# Patient Record
Sex: Female | Born: 1965 | Race: White | Hispanic: No | Marital: Married | Smoking: Former smoker
Health system: Southern US, Community
[De-identification: ages and names within clinical notes are randomized; demographics above are authoritative.]

## PROBLEM LIST (undated history)

## (undated) DIAGNOSIS — F32A Depression, unspecified: Secondary | ICD-10-CM

## (undated) DIAGNOSIS — R87619 Unspecified abnormal cytological findings in specimens from cervix uteri: Secondary | ICD-10-CM

## (undated) DIAGNOSIS — K219 Gastro-esophageal reflux disease without esophagitis: Secondary | ICD-10-CM

## (undated) DIAGNOSIS — K259 Gastric ulcer, unspecified as acute or chronic, without hemorrhage or perforation: Secondary | ICD-10-CM

## (undated) DIAGNOSIS — O039 Complete or unspecified spontaneous abortion without complication: Secondary | ICD-10-CM

## (undated) DIAGNOSIS — F329 Major depressive disorder, single episode, unspecified: Secondary | ICD-10-CM

## (undated) DIAGNOSIS — K635 Polyp of colon: Secondary | ICD-10-CM

## (undated) HISTORY — DX: Gastric ulcer, unspecified as acute or chronic, without hemorrhage or perforation: K25.9

## (undated) HISTORY — DX: Unspecified abnormal cytological findings in specimens from cervix uteri: R87.619

## (undated) HISTORY — DX: Major depressive disorder, single episode, unspecified: F32.9

## (undated) HISTORY — PX: APPENDECTOMY: SHX54

## (undated) HISTORY — DX: Gastro-esophageal reflux disease without esophagitis: K21.9

## (undated) HISTORY — DX: Complete or unspecified spontaneous abortion without complication: O03.9

## (undated) HISTORY — DX: Polyp of colon: K63.5

## (undated) HISTORY — DX: Depression, unspecified: F32.A

---

## 1997-07-06 ENCOUNTER — Other Ambulatory Visit: Admission: RE | Admit: 1997-07-06 | Discharge: 1997-07-06 | Payer: Self-pay | Admitting: *Deleted

## 1998-01-05 ENCOUNTER — Other Ambulatory Visit: Admission: RE | Admit: 1998-01-05 | Discharge: 1998-01-05 | Payer: Self-pay | Admitting: Obstetrics and Gynecology

## 1998-06-30 ENCOUNTER — Other Ambulatory Visit: Admission: RE | Admit: 1998-06-30 | Discharge: 1998-06-30 | Payer: Self-pay | Admitting: Obstetrics and Gynecology

## 1998-12-14 ENCOUNTER — Ambulatory Visit (HOSPITAL_COMMUNITY): Admission: RE | Admit: 1998-12-14 | Discharge: 1998-12-14 | Payer: Self-pay | Admitting: Gastroenterology

## 1998-12-28 ENCOUNTER — Other Ambulatory Visit: Admission: RE | Admit: 1998-12-28 | Discharge: 1998-12-28 | Payer: Self-pay | Admitting: *Deleted

## 1999-01-11 ENCOUNTER — Ambulatory Visit (HOSPITAL_COMMUNITY): Admission: RE | Admit: 1999-01-11 | Discharge: 1999-01-11 | Payer: Self-pay | Admitting: Gastroenterology

## 2000-09-13 ENCOUNTER — Other Ambulatory Visit: Admission: RE | Admit: 2000-09-13 | Discharge: 2000-09-13 | Payer: Self-pay | Admitting: Obstetrics and Gynecology

## 2001-11-28 ENCOUNTER — Other Ambulatory Visit: Admission: RE | Admit: 2001-11-28 | Discharge: 2001-11-28 | Payer: Self-pay | Admitting: Obstetrics and Gynecology

## 2002-11-11 ENCOUNTER — Other Ambulatory Visit: Admission: RE | Admit: 2002-11-11 | Discharge: 2002-11-11 | Payer: Self-pay | Admitting: Obstetrics and Gynecology

## 2003-09-29 ENCOUNTER — Inpatient Hospital Stay (HOSPITAL_COMMUNITY): Admission: AD | Admit: 2003-09-29 | Discharge: 2003-10-01 | Payer: Self-pay | Admitting: Obstetrics and Gynecology

## 2004-10-29 ENCOUNTER — Ambulatory Visit (HOSPITAL_COMMUNITY): Admission: RE | Admit: 2004-10-29 | Discharge: 2004-10-29 | Payer: Self-pay | Admitting: Obstetrics and Gynecology

## 2004-10-29 ENCOUNTER — Encounter (INDEPENDENT_AMBULATORY_CARE_PROVIDER_SITE_OTHER): Payer: Self-pay | Admitting: Specialist

## 2006-01-21 ENCOUNTER — Inpatient Hospital Stay (HOSPITAL_COMMUNITY): Admission: AD | Admit: 2006-01-21 | Discharge: 2006-01-23 | Payer: Self-pay | Admitting: Obstetrics and Gynecology

## 2007-02-14 LAB — CONVERTED CEMR LAB: Pap Smear: NORMAL

## 2007-11-26 ENCOUNTER — Ambulatory Visit: Payer: Self-pay | Admitting: Family Medicine

## 2007-11-26 DIAGNOSIS — F3289 Other specified depressive episodes: Secondary | ICD-10-CM | POA: Insufficient documentation

## 2007-11-26 DIAGNOSIS — L2089 Other atopic dermatitis: Secondary | ICD-10-CM | POA: Insufficient documentation

## 2007-11-26 DIAGNOSIS — J309 Allergic rhinitis, unspecified: Secondary | ICD-10-CM | POA: Insufficient documentation

## 2007-11-26 DIAGNOSIS — F329 Major depressive disorder, single episode, unspecified: Secondary | ICD-10-CM | POA: Insufficient documentation

## 2007-11-26 DIAGNOSIS — K219 Gastro-esophageal reflux disease without esophagitis: Secondary | ICD-10-CM | POA: Insufficient documentation

## 2007-12-02 ENCOUNTER — Ambulatory Visit: Payer: Self-pay | Admitting: Family Medicine

## 2007-12-09 LAB — CONVERTED CEMR LAB
AST: 17 units/L (ref 0–37)
Alkaline Phosphatase: 68 units/L (ref 39–117)
BUN: 12 mg/dL (ref 6–23)
Bilirubin, Direct: 0.2 mg/dL (ref 0.0–0.3)
Chloride: 104 meq/L (ref 96–112)
Cholesterol: 141 mg/dL (ref 0–200)
GFR calc Af Amer: 102 mL/min
GFR calc non Af Amer: 84 mL/min
Glucose, Bld: 92 mg/dL (ref 70–99)
Potassium: 4.2 meq/L (ref 3.5–5.1)
Sodium: 140 meq/L (ref 135–145)
Total Protein: 7 g/dL (ref 6.0–8.3)
VLDL: 16 mg/dL (ref 0–40)

## 2008-08-20 ENCOUNTER — Encounter: Payer: Self-pay | Admitting: Family Medicine

## 2008-08-21 ENCOUNTER — Ambulatory Visit: Payer: Self-pay | Admitting: Family Medicine

## 2008-08-21 DIAGNOSIS — R0789 Other chest pain: Secondary | ICD-10-CM | POA: Insufficient documentation

## 2009-01-12 ENCOUNTER — Ambulatory Visit: Payer: Self-pay | Admitting: Family Medicine

## 2009-12-21 ENCOUNTER — Ambulatory Visit: Payer: Self-pay | Admitting: Family Medicine

## 2010-01-19 DIAGNOSIS — J019 Acute sinusitis, unspecified: Secondary | ICD-10-CM | POA: Insufficient documentation

## 2010-01-20 ENCOUNTER — Ambulatory Visit: Payer: Self-pay | Admitting: Internal Medicine

## 2010-03-15 NOTE — Assessment & Plan Note (Signed)
Summary: ?SINUS INFECTION/CLE   Vital Signs:  Patient profile:   45 year old female Height:      64.5 inches Weight:      185.50 pounds BMI:     31.46 Temp:     99.3 degrees F oral Pulse rate:   64 / minute Pulse rhythm:   regular BP sitting:   122 / 82  (left arm) Cuff size:   large  Vitals Entered By: Selena Batten Dance CMA Duncan Dull) (January 19, 2010 12:33 PM) CC: ? Sinus infection Comments Symptoms x2 weeks. No relief with Allegra and Mucinex two times a day    History of Present Illness: CC: sinus pressure  2 wk h/o sinus pressures.  Started as allergies - nasal congestion, coughing, sniffling.  Has been outside cleaning leaves.  Mild productive cough yellow sputum.  + yellow purulent nasal discharge.  + frontal sinus pressure.  using mucinex and allegra regularly for 2 wks.  Also taking tylenol.  Yesterday achey.  + subjective feverish yesterday.  clammy and hot.  No abd pain, n/v/d, rashes.  No ear pain or tooth pain.  Daughter on and off sick as well.  Better with singulair/zyrtec.  No smokers at home.  No asthma.  Current Medications (verified): 1)  Budeprion Sr 100 Mg Xr12h-Tab (Bupropion Hcl) .... Take 1 Tablet By Mouth Three Times A Day  Allergies: 1)  ! Codeine  Past History:  Past Medical History: Last updated: 11/26/2007 Depression GERD  Social History: Last updated: 11/26/2007 Occupation: [employer], occupational Married 2 children Never Smoked Alcohol use-no Drug use-no Regular exercise-walks a lot Diet: healthy, fruits and veggies  Review of Systems       per HPI  Physical Exam  General:  Well-developed,well-nourished,in no acute distress; alert,appropriate and cooperative throughout examination Head:  Normocephalic and atraumatic without obvious abnormalities. No apparent alopecia or balding.  nontender sinuses Eyes:  PERRLA, EOMI Ears:  TMs clear bilaterally Nose:  nares clear, slight injection Mouth:  MMM, no pharyngeal erythema Neck:  R AC  LAD Lungs:  Normal respiratory effort, chest expands symmetrically. Lungs are clear to auscultation, no crackles or wheezes. Heart:  Normal rate and regular rhythm. S1 and S2 normal without gallop, murmur, click, rub or other extra sounds. Pulses:  2+ rad pulses Extremities:  no pedal edema Skin:  Intact without suspicious lesions or rashes   Impression & Recommendations:  Problem # 1:  SINUSITIS, ACUTE (ICD-461.9) hold allegra for now, rec start nasal saline/neti pot, amoxicillin, continue mucinex.  discussed option of flonase.  red flags to return discussed Her updated medication list for this problem includes:    Amoxicillin 875 Mg Tabs (Amoxicillin) ..... One by mouth two times a day x 1 0days  Complete Medication List: 1)  Budeprion Sr 100 Mg Xr12h-tab (Bupropion hcl) .... Take 1 tablet by mouth three times a day 2)  Amoxicillin 875 Mg Tabs (Amoxicillin) .... One by mouth two times a day x 1 0days  Patient Instructions: 1)  You have a sinus infection. 2)  Take medicines as prescribed: amoxicillin twice daily for 10 days. 3)  Take guaifenesin 400mg  IR 1 1/2 pills in am and at noon with plenty of fluid to help mobilize mucous. 4)  Use nasal saline spray or neti pot to help drainage of sinuses. 5)  If you start having fevers >101.5, trouble swallowing or breathing, or are worsening instead of improving as expected, you may need to be seen again. 6)  Good to see  you today, call clinic with questions.  Prescriptions: AMOXICILLIN 875 MG TABS (AMOXICILLIN) one by mouth two times a day x 1 0days  #20 x 0   Entered and Authorized by:   Eustaquio Boyden  MD   Signed by:   Eustaquio Boyden  MD on 01/19/2010   Method used:   Electronically to        [facility]* (retail)       [address]       [place], [place]  [address]       Ph: [phone]       Fax: [phone]   RxID:   [id number]    Orders Added: 1)  Est. Patient Level III [81017]    Current Allergies (reviewed  today): ! CODEINE

## 2010-03-15 NOTE — Assessment & Plan Note (Signed)
Summary: FLU SHOT/CLE  Nurse Visit   Allergies: 1)  ! Codeine  Orders Added: 1)  Admin 1st Vaccine [90471] 2)  Flu Vaccine 45yrs + [16109]    Flu Vaccine Consent Questions     Do you have a history of severe allergic reactions to this vaccine? no    Any prior history of allergic reactions to egg and/or gelatin? no    Do you have a sensitivity to the preservative Thimersol? no    Do you have a past history of Guillan-Barre Syndrome? no    Do you currently have an acute febrile illness? no    Have you ever had a severe reaction to latex? no    Vaccine information given and explained to patient? yes    Are you currently pregnant? no    Lot Number:[id number]   Exp Date:08/13/2010   Site Given  Left Deltoid IM

## 2010-07-01 NOTE — Op Note (Signed)
NAME:  Kendra Jimenez, Kendra Jimenez NO.:  [id number]   MEDICAL RECORD NO.:  [id number]          PATIENT TYPE:  AMB   LOCATION:  [facility]C                           FACILITY:  [facility]   PHYSICIAN:  Richardean Sale, M.D.   DATE OF BIRTH:  1965/09/19   DATE OF PROCEDURE:  10/29/2004  DATE OF DISCHARGE:  10/29/2004                                 OPERATIVE REPORT   PREOPERATIVE DIAGNOSIS:  First trimester missed AB.   POSTOPERATIVE DIAGNOSES:  First trimester missed AB.   PROCEDURE:  Suction D&E.   SURGEON:  Richardean Sale, M.D.   ANESTHESIA:  General with paracervical block.   COMPLICATIONS:  None.   ESTIMATED BLOOD LOSS:  Minimal.   FINDINGS:  Moderate amount of products of conception.   SPECIMENS  products of conception to pathology   INDICATIONS:  This is a 45 year old gravida 3, para 1-0-1-1 white female who  presented at approximately [redacted] weeks gestation complaining of vaginal  spotting. The patient would evaluation with ultrasound which showed a  nonviable intrauterine pregnancy. The fetal pole was noted but no cardiac  activity was seen and the yolk sac appeared enlarged. On the day prior to  the procedure, the patient began to complain of some increase in her vaginal  bleeding and cramping but denies any passage of tissue. Prior to the  procedure, risks, benefits and alternatives of procedure reviewed with the  patient in detail. We discussed the risk of hemorrhage, infection, uterine  injury which could require hysterectomy or surgery on the bowel or bladder.  Also reviewed general anesthetic related risks including DVT, pulmonary  embolus. The patient voiced understanding of these risks and agreed to  proceed before proceeding to the OR.   PROCEDURE:  The patient was taken to the operating room where she was given  general anesthetic. She was then prepped and draped in usual sterile fashion  with Betadine and red rubber catheter was used to drain the bladder. The  bimanual exam was performed which revealed the presence of an 8 weeks size  uterus that was slightly boggy. No obvious masses. It was mobile. Adnexa  were normal. A speculum was then placed in the vagina and 2 mL of 1%  Nesacaine was injected at the 12 o'clock position on the cervix. The single-  tooth tenaculum was then used to grasp the anterior lip of cervix and  paracervical block was then administered using a total of 20 mL of 1%  Nesacaine. The cervix was already slightly dilated with blood clot noted in  the os. The cervix was then gently dilated with Hegar dilators and #8  suction curette was introduced. Suction was performed with a moderate amount  of products of conception removed. This was followed by sharp curettage  until a gritty texture was noted in all four quadrants. The suction was  reintroduced to remove any additional pieces of tissue and the procedure was  then terminated. There was no bleeding coming from the cervical os. The  tenaculum was removed. The tenaculum site was cauterized with silver  nitrate. Speculum was then removed. Bimanual examination was performed.  Uterus was now small, midline, mobile, no masses noted. The patient was then  taken out of the dorsal lithotomy position and was transferred to the  recovery room awake and stable condition. The patient tolerated the  procedure very well with no complication. All sponge, lap, needle and  instrument counts were correct x2.      Richardean Sale, M.D.  Electronically Signed     JW/MEDQ  D:  11/02/2004  T:  11/02/2004  Job:  [id number]

## 2010-07-01 NOTE — H&P (Signed)
NAME:  Kendra Jimenez, Kendra Jimenez NO.:  [id number]   MEDICAL RECORD NO.:  [id number]           PATIENT TYPE:   LOCATION:                                 FACILITY:   PHYSICIAN:  Richardean Sale, M.D.   DATE OF BIRTH:  31-Jan-1966   DATE OF ADMISSION:  10/29/2004  DATE OF DISCHARGE:                                HISTORY & PHYSICAL   PREOPERATIVE DIAGNOSIS:  First trimester missed abortion.   HISTORY OF PRESENT ILLNESS:  This is a 45 year old gravida 3, para 1-0-1-1  white female with a last menstrual period August 23, 2004 who presented on  October 27, 2004 at nine weeks by last menstrual period with complaints of  vaginal bleeding.  The patient underwent ultrasound evaluation and was found  to have a fetal pole measuring seven weeks' gestation without any cardiac  activity noted.  Yolk sac was enlarged and abnormality shaped.  The patient  presents today for surgical management with D&E.  She complains of vaginal  spotting but denies any significant pain or passage of tissue.   PAST MEDICAL HISTORY:  1.  Positive for depression, not currently on medication.  2.  Irritable bowel syndrome.   OB HISTORY:  1.  TAB x1.  2.  Spontaneous vaginal delivery at term x1.  No complications.   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   PAST SURGICAL HISTORY:  1.  Appendectomy.  2.  Wisdom teeth.   FAMILY HISTORY:  No known Down syndrome, spina bifida, or general anomalies,  chromosomal abnormalities, or cystic fibrosis.  No bleeding or blood  clotting disorders.  Positive for hypertension, heart disease, diabetes,  renal cancer, and Crohn's disease.   SOCIAL HISTORY:  She is married.  Denies alcohol, tobacco or drugs.   MEDICATIONS:  Prenatal vitamins.   ALLERGIES:  CODEINE and IODINE.   PHYSICAL EXAMINATION:  VITAL SIGNS:  Afebrile. Vital signs stable.  GENERAL:  She is well-developed, well-nourished white female in no acute  distress.  NECK AND THYROID:   Within normal limits.  HEART:  Regular rate and rhythm.  LUNGS:  Clear to auscultation bilaterally.  ABDOMEN:  Soft, nontender, nondistended.  No palpable masses.  Liver and  spleen are normal.  PELVIC:  Deferred to the OR.  EXTREMITIES:  No clubbing, cyanosis or edema.   LABORATORY DATA:  Blood type is AB positive, antibody screen negative. RPR  nonreactive.  Ultrasound shows intrauterine gestational sac with fetal pole  with no current activity and large yolk sac.   ASSESSMENT:  A 45 year old, gravida 3, para 1-0-1-1 white female with first  trimester missed abortion, presents for surgical management.   PLAN:  Risks, benefits and alternatives of dilation and evacuation have been  reviewed by the patient in detail.  We discussed risks which include but are  not limited to hemorrhage requiring transfusion, infection, scarring of the  uterus or Asherman's syndrome which could cause problems getting pregnant in  the future, injury to the uterus such as uterine perforation which would  require additional surgery through the abdomen and possible injury to the  bowel, the bladder or other intra-abdominal organs.  Also reviewed  anesthetic-related risks and general operative risks.  The patient voiced  understanding of all these other risks and desires to proceed.  Informed  consent is then obtained and all questions answered.      Richardean Sale, M.D.  Electronically Signed     JW/MEDQ  D:  10/28/2004  T:  10/29/2004  Job:  [id number]

## 2010-07-01 NOTE — H&P (Signed)
NAME:  Kendra Jimenez, Kendra Jimenez NO.:  [id number]   MEDICAL RECORD NO.:  [id number]                   PATIENT TYPE:   LOCATION:                                       FACILITY:   PHYSICIAN:  Richardean Sale, M.D.                DATE OF BIRTH:  10-03-1965   DATE OF ADMISSION:  09/29/2003  DATE OF DISCHARGE:                                HISTORY & PHYSICAL   ADMITTING DIAGNOSIS:  Term intrauterine pregnancy for induction of labor.   HISTORY OF PRESENT ILLNESS:  This is a 45 year old, gravida 2, para 0-0-1-0,  white female at 40-1/2 weeks' gestation with a due date of September 26, 2003,  who presents for induction of labor.  The patient's pregnancy has been  complicated by advanced maternal age.  She underwent attempt at first  trimester screen but presented at 14 weeks and given crown-rump length, a  first trimester screen was unable to be performed.  The patient subsequently  declined amniocentesis.  Triple test was performed and within normal limits.  Anatomy ultrasound was within normal limits as well.   SOCIAL HISTORY:  Significant for husband with history of HSV.  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  She has been on  Valtrex for suppressive therapy since 35 weeks and has had no symptoms.  Due  date set by ultrasound performed in the first trimester consistent with last  menstrual period.   Prenatal care has been at [facility] with Dr. Richardean Sale as the  primary physician.  Today, patient reports excellent fetal movement, denies  any loss of fluid, vaginal bleeding, or regular contractions.  Complains of  increasing pelvic pressure and discomfort.  Denies any headache, visual  changes or epigastric pain.   PAST MEDICAL HISTORY:  Positive history of irritable bowel syndrome and  depression.   PAST SURGICAL HISTORY:  1. Wisdom teeth.  2. Appendectomy.   OBSTETRIC HISTORY:  TAB x 1.   GYNECOLOGIC HISTORY:  1.  Remote history of Chlamydia.  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  No treatment.  Last Pap within normal limits.  2. Menses regular q.28d., lasting 3-4 days in length.  3. Menarche at age 34.   FAMILY HISTORY:  Significant for diabetes.  No congenital anomalies, infants  born with birth defects, trisomus, spina bifida, cystic fibrosis, or other  inherited illnesses.   SOCIAL HISTORY:  She is married.  No tobacco, alcohol, or drugs.   MEDICATIONS:  Prenatal vitamins, Valtrex.   ALLERGIES:  1. CODEINE causes itching.  2. IODINE causing redness and edema.  3. BETADINE causes redness of affected areas.   PHYSICAL EXAMINATION:  VITAL SIGNS:  Afebrile.  Vital signs are stable.  Blood pressure is 112/74, weight 218.  Fetal heart tones 131 by Doppler.  GENERAL:  She is a well-developed, well-nourished white female in  no  apparent distress.  HEART:  Regular rate and rhythm.  LUNGS:  Clear to auscultation bilaterally.  ABDOMEN:  Gravid, soft, nontender.  Fundal height is 39.  EXTREMITIES:  Trace edema bilaterally.  No cyanosis or clubbing.  Extremities nontender.  Deep tendon reflexes 2+ bilaterally with no clonus.  CERVIX:  2 cm dilated, 70% effaced, -1 station, vertex, soft, mid position.   PRENATAL LABORATORY DATA:  Pap within normal limits.  Gonorrhea and  Chlamydia screen negative.  Blood type AB positive, antibody screen  negative, toxoplasmosis IgG positive, IgM negative, RPR nonreactive, rubella  immune, hepatitis B surface antigen nonreactive, HIV nonreactive, triple  test within normal limits, 1 hour Glucola 87, group B beta strep negative,  28 week hemoglobin 10.7.  Last ultrasound estimated fetal weight of 3287 g  at 38 weeks.   ASSESSMENT:  A 45 year old, gravida 2, para 0-0-1-0 white female at 40+  weeks gestation with favorable cervix at term.  Possible large for  gestational age infant.   PLAN:  1. The patient would like to proceed with  induction of labor.  Explained to     her that given estimated fetal weight at 38 weeks, the baby could weigh     upwards of 9 pounds, although her fundal height has been within normal     limits.  Reviewed with her possibility of shoulder dystocia should     macrosomia be encountered which could result in permanent neurologic     damage to the fetus, brain damage, injury to the brachial plexus with     paralysis of the upper extremity, possible third or fourth-degree     perineal laceration which could cause complications in the future such as     chronic pain or difficulty with defecation.  The patient voices     understanding of all these risks and desires to proceed with induction.  2. We will admit to labor and delivery.  Plan Pitocin per protocol.     Amniotomy.  We will place internal monitors with IEPC to monitor for     contractions and monitor labor curve closely, continuous fetal     monitoring, epidural when in active labor.                                               Richardean Sale, M.D.    JW/MEDQ  D:  09/28/2003  T:  09/28/2003  Job:  [id number]

## 2010-07-01 NOTE — Op Note (Signed)
NAME:  Kendra Jimenez, SIVAK NO.:  [id number]   MEDICAL RECORD NO.:  [id number]          PATIENT TYPE:  AMB   LOCATION:  SDC                           FACILITY:  [facility]   PHYSICIAN:  Richardean Sale, M.D.   DATE OF BIRTH:  1965/06/26   DATE OF PROCEDURE:  10/29/2004  DATE OF DISCHARGE:                                 OPERATIVE REPORT   PREOPERATIVE DIAGNOSIS:  First trimester missed AB.   POSTOPERATIVE DIAGNOSIS:  First trimester missed AB.   PROCEDURE:  Suction D&E.   SURGEON:  Dr. Richardean Sale.   ASSISTANT:  None.   ANESTHESIA:  Conscious sedation with paracervical block.   COMPLICATIONS:  None.   ESTIMATED BLOOD LOSS:  Minimal.   FINDINGS:  A moderate amount of products of conception sent to pathology.   INDICATIONS:  This is a 39-year gravida 3, para 1-0-1-1 white female, who  presented at 85 weeks' gestation by last menstrual period with vaginal  bleeding.  Ultrasound was performed which revealed a 6-7 weeks' size fetal  pole with no cardiac activity and an enlarged, irregularly shaped yolk sac  consistent with a missed AB.  The patient presents today for surgical  management. Prior to procedure, the risks, benefits, and alternatives of  surgical management with D&E were reviewed with the patient in detail.  We  discussed risks of hemorrhage, infection, injury to the uterus which could  require additional surgery through the abdomen to evaluate for injury to the  bowel or bladder.  I also reviewed general anesthetic-related risks and  general surgical risks.  The patient voiced understanding of all these risks  before proceeding, and informed consent was obtained.  All questions were  answered.   PROCEDURE:  The patient was taken to the operating room where she was given  conscious sedation.  She was then prepped and draped in the usual sterile  fashion with Hibiclens.  She has an allergy to BETADINE.  A red rubber  catheter was then used to drain the  bladder.  Bimanual exam was performed  which revealed the presence of an 8-week size uterus that was boggy, mobile,  anteverted.  No adnexal masses palpated.  The speculum was placed in the  vagina, and 20 mL of 1% Nesacaine were then injected to administer a  paracervical block.  A single-tooth tenaculum was used to grasp the anterior  lip of the cervix.  The uterus then sounded to 8.5 2 cm.  The cervix was  dilated to #25, and the #8 suction curette was then introduced.  Suction was  performed, yielding a moderate amounts of products of conception.  This was  followed by curettage.  The suction was reintroduced to remove any  additional products, and curettage was performed until a gritty texture was  noted in all four quadrants.  At this point, the procedure was terminated.  There was no bleeding coming from the cervical os.  The single-tooth  tenaculum was removed.  The tenaculum site was made hemostatic with  application of silver nitrate.  Speculum was then removed.  The uterus was then examined.  It was now small, midline, firm, no masses,  and mobile.  The patient tolerated the procedure very well.  All sponge,  lap, needle, and instrument  counts were correct x2.  The patient was taken  to the recovery room awake and in stable condition.  There were no  complications.      Richardean Sale, M.D.     JW/MEDQ  D:  10/29/2004  T:  10/29/2004  Job:  [id number]

## 2010-07-04 ENCOUNTER — Other Ambulatory Visit: Payer: Self-pay | Admitting: Obstetrics

## 2011-01-11 ENCOUNTER — Ambulatory Visit (INDEPENDENT_AMBULATORY_CARE_PROVIDER_SITE_OTHER): Payer: BC Managed Care – PPO

## 2011-01-11 DIAGNOSIS — Z23 Encounter for immunization: Secondary | ICD-10-CM

## 2011-11-23 ENCOUNTER — Ambulatory Visit (INDEPENDENT_AMBULATORY_CARE_PROVIDER_SITE_OTHER): Payer: BC Managed Care – PPO

## 2011-11-23 DIAGNOSIS — Z23 Encounter for immunization: Secondary | ICD-10-CM

## 2012-12-25 ENCOUNTER — Ambulatory Visit (INDEPENDENT_AMBULATORY_CARE_PROVIDER_SITE_OTHER): Payer: BC Managed Care – PPO

## 2012-12-25 ENCOUNTER — Ambulatory Visit: Payer: BC Managed Care – PPO

## 2012-12-25 DIAGNOSIS — Z23 Encounter for immunization: Secondary | ICD-10-CM

## 2014-06-04 ENCOUNTER — Telehealth: Payer: Self-pay | Admitting: *Deleted

## 2014-06-04 MED ORDER — AMOXICILLIN 500 MG PO CAPS: 1000.0000 mg | ORAL_CAPSULE | Freq: Two times a day (BID) | ORAL | Status: DC

## 2014-06-04 NOTE — Telephone Encounter (Signed)
I do not have a pharmacy... Send in amox as written.

## 2014-06-04 NOTE — Telephone Encounter (Signed)
Mom calling stating that both her daughters where seen here and diag with strep, now mom is having symptoms and is asking for abx. Per Dr. Ermalene SearingBedsole she would call something in.

## 2014-06-04 NOTE — Telephone Encounter (Signed)
Spoke with patient, [facility].  Medication phoned to pharmacy.

## 2015-06-04 DIAGNOSIS — M9901 Segmental and somatic dysfunction of cervical region: Secondary | ICD-10-CM | POA: Diagnosis not present

## 2015-06-04 DIAGNOSIS — R51 Headache: Secondary | ICD-10-CM | POA: Diagnosis not present

## 2015-06-04 DIAGNOSIS — M5412 Radiculopathy, cervical region: Secondary | ICD-10-CM | POA: Diagnosis not present

## 2015-06-04 DIAGNOSIS — M9902 Segmental and somatic dysfunction of thoracic region: Secondary | ICD-10-CM | POA: Diagnosis not present

## 2015-06-04 DIAGNOSIS — M791 Myalgia: Secondary | ICD-10-CM | POA: Diagnosis not present

## 2015-06-08 DIAGNOSIS — M9902 Segmental and somatic dysfunction of thoracic region: Secondary | ICD-10-CM | POA: Diagnosis not present

## 2015-06-08 DIAGNOSIS — M9901 Segmental and somatic dysfunction of cervical region: Secondary | ICD-10-CM | POA: Diagnosis not present

## 2015-06-08 DIAGNOSIS — M5412 Radiculopathy, cervical region: Secondary | ICD-10-CM | POA: Diagnosis not present

## 2015-06-14 DIAGNOSIS — N83202 Unspecified ovarian cyst, left side: Secondary | ICD-10-CM | POA: Diagnosis not present

## 2015-06-23 DIAGNOSIS — M4012 Other secondary kyphosis, cervical region: Secondary | ICD-10-CM | POA: Diagnosis not present

## 2015-06-23 DIAGNOSIS — M50323 Other cervical disc degeneration at C6-C7 level: Secondary | ICD-10-CM | POA: Diagnosis not present

## 2015-06-23 DIAGNOSIS — M9901 Segmental and somatic dysfunction of cervical region: Secondary | ICD-10-CM | POA: Diagnosis not present

## 2015-06-23 DIAGNOSIS — M62830 Muscle spasm of back: Secondary | ICD-10-CM | POA: Diagnosis not present

## 2015-06-24 DIAGNOSIS — M9901 Segmental and somatic dysfunction of cervical region: Secondary | ICD-10-CM | POA: Diagnosis not present

## 2015-06-24 DIAGNOSIS — M62830 Muscle spasm of back: Secondary | ICD-10-CM | POA: Diagnosis not present

## 2015-06-24 DIAGNOSIS — M4012 Other secondary kyphosis, cervical region: Secondary | ICD-10-CM | POA: Diagnosis not present

## 2015-06-24 DIAGNOSIS — M50323 Other cervical disc degeneration at C6-C7 level: Secondary | ICD-10-CM | POA: Diagnosis not present

## 2015-06-25 DIAGNOSIS — M9901 Segmental and somatic dysfunction of cervical region: Secondary | ICD-10-CM | POA: Diagnosis not present

## 2015-06-25 DIAGNOSIS — M50323 Other cervical disc degeneration at C6-C7 level: Secondary | ICD-10-CM | POA: Diagnosis not present

## 2015-06-25 DIAGNOSIS — M62830 Muscle spasm of back: Secondary | ICD-10-CM | POA: Diagnosis not present

## 2015-06-25 DIAGNOSIS — M4012 Other secondary kyphosis, cervical region: Secondary | ICD-10-CM | POA: Diagnosis not present

## 2015-06-28 DIAGNOSIS — M62830 Muscle spasm of back: Secondary | ICD-10-CM | POA: Diagnosis not present

## 2015-06-28 DIAGNOSIS — M50323 Other cervical disc degeneration at C6-C7 level: Secondary | ICD-10-CM | POA: Diagnosis not present

## 2015-06-28 DIAGNOSIS — M4012 Other secondary kyphosis, cervical region: Secondary | ICD-10-CM | POA: Diagnosis not present

## 2015-06-28 DIAGNOSIS — M9901 Segmental and somatic dysfunction of cervical region: Secondary | ICD-10-CM | POA: Diagnosis not present

## 2015-06-30 DIAGNOSIS — M62830 Muscle spasm of back: Secondary | ICD-10-CM | POA: Diagnosis not present

## 2015-06-30 DIAGNOSIS — M4012 Other secondary kyphosis, cervical region: Secondary | ICD-10-CM | POA: Diagnosis not present

## 2015-06-30 DIAGNOSIS — M9901 Segmental and somatic dysfunction of cervical region: Secondary | ICD-10-CM | POA: Diagnosis not present

## 2015-06-30 DIAGNOSIS — M50323 Other cervical disc degeneration at C6-C7 level: Secondary | ICD-10-CM | POA: Diagnosis not present

## 2015-07-01 DIAGNOSIS — M50323 Other cervical disc degeneration at C6-C7 level: Secondary | ICD-10-CM | POA: Diagnosis not present

## 2015-07-01 DIAGNOSIS — M62830 Muscle spasm of back: Secondary | ICD-10-CM | POA: Diagnosis not present

## 2015-07-01 DIAGNOSIS — M9901 Segmental and somatic dysfunction of cervical region: Secondary | ICD-10-CM | POA: Diagnosis not present

## 2015-07-01 DIAGNOSIS — M4012 Other secondary kyphosis, cervical region: Secondary | ICD-10-CM | POA: Diagnosis not present

## 2015-07-09 DIAGNOSIS — R109 Unspecified abdominal pain: Secondary | ICD-10-CM | POA: Diagnosis not present

## 2015-07-09 DIAGNOSIS — Z113 Encounter for screening for infections with a predominantly sexual mode of transmission: Secondary | ICD-10-CM | POA: Diagnosis not present

## 2015-07-09 DIAGNOSIS — R1032 Left lower quadrant pain: Secondary | ICD-10-CM | POA: Diagnosis not present

## 2015-07-13 DIAGNOSIS — M9901 Segmental and somatic dysfunction of cervical region: Secondary | ICD-10-CM | POA: Diagnosis not present

## 2015-07-13 DIAGNOSIS — M62830 Muscle spasm of back: Secondary | ICD-10-CM | POA: Diagnosis not present

## 2015-07-13 DIAGNOSIS — M50323 Other cervical disc degeneration at C6-C7 level: Secondary | ICD-10-CM | POA: Diagnosis not present

## 2015-07-13 DIAGNOSIS — M4012 Other secondary kyphosis, cervical region: Secondary | ICD-10-CM | POA: Diagnosis not present

## 2015-07-14 DIAGNOSIS — M50323 Other cervical disc degeneration at C6-C7 level: Secondary | ICD-10-CM | POA: Diagnosis not present

## 2015-07-14 DIAGNOSIS — M62830 Muscle spasm of back: Secondary | ICD-10-CM | POA: Diagnosis not present

## 2015-07-14 DIAGNOSIS — M4012 Other secondary kyphosis, cervical region: Secondary | ICD-10-CM | POA: Diagnosis not present

## 2015-07-14 DIAGNOSIS — M9901 Segmental and somatic dysfunction of cervical region: Secondary | ICD-10-CM | POA: Diagnosis not present

## 2015-07-15 DIAGNOSIS — M62830 Muscle spasm of back: Secondary | ICD-10-CM | POA: Diagnosis not present

## 2015-07-15 DIAGNOSIS — M4012 Other secondary kyphosis, cervical region: Secondary | ICD-10-CM | POA: Diagnosis not present

## 2015-07-15 DIAGNOSIS — M9901 Segmental and somatic dysfunction of cervical region: Secondary | ICD-10-CM | POA: Diagnosis not present

## 2015-07-15 DIAGNOSIS — M50323 Other cervical disc degeneration at C6-C7 level: Secondary | ICD-10-CM | POA: Diagnosis not present

## 2015-07-21 DIAGNOSIS — R109 Unspecified abdominal pain: Secondary | ICD-10-CM | POA: Diagnosis not present

## 2015-07-23 DIAGNOSIS — M62830 Muscle spasm of back: Secondary | ICD-10-CM | POA: Diagnosis not present

## 2015-07-23 DIAGNOSIS — M9901 Segmental and somatic dysfunction of cervical region: Secondary | ICD-10-CM | POA: Diagnosis not present

## 2015-07-23 DIAGNOSIS — M50323 Other cervical disc degeneration at C6-C7 level: Secondary | ICD-10-CM | POA: Diagnosis not present

## 2015-07-23 DIAGNOSIS — M4012 Other secondary kyphosis, cervical region: Secondary | ICD-10-CM | POA: Diagnosis not present

## 2015-07-29 DIAGNOSIS — Z1211 Encounter for screening for malignant neoplasm of colon: Secondary | ICD-10-CM | POA: Diagnosis not present

## 2015-07-30 DIAGNOSIS — M9901 Segmental and somatic dysfunction of cervical region: Secondary | ICD-10-CM | POA: Diagnosis not present

## 2015-07-30 DIAGNOSIS — M50323 Other cervical disc degeneration at C6-C7 level: Secondary | ICD-10-CM | POA: Diagnosis not present

## 2015-07-30 DIAGNOSIS — M62830 Muscle spasm of back: Secondary | ICD-10-CM | POA: Diagnosis not present

## 2015-07-30 DIAGNOSIS — M4012 Other secondary kyphosis, cervical region: Secondary | ICD-10-CM | POA: Diagnosis not present

## 2015-08-04 DIAGNOSIS — D122 Benign neoplasm of ascending colon: Secondary | ICD-10-CM | POA: Diagnosis not present

## 2015-08-04 DIAGNOSIS — K6389 Other specified diseases of intestine: Secondary | ICD-10-CM | POA: Diagnosis not present

## 2015-08-04 DIAGNOSIS — K635 Polyp of colon: Secondary | ICD-10-CM | POA: Diagnosis not present

## 2015-08-04 DIAGNOSIS — Z1211 Encounter for screening for malignant neoplasm of colon: Secondary | ICD-10-CM | POA: Diagnosis not present

## 2015-08-04 DIAGNOSIS — D125 Benign neoplasm of sigmoid colon: Secondary | ICD-10-CM | POA: Diagnosis not present

## 2015-08-04 DIAGNOSIS — D123 Benign neoplasm of transverse colon: Secondary | ICD-10-CM | POA: Diagnosis not present

## 2015-08-05 ENCOUNTER — Other Ambulatory Visit: Payer: Self-pay | Admitting: Gastroenterology

## 2015-08-05 DIAGNOSIS — M9901 Segmental and somatic dysfunction of cervical region: Secondary | ICD-10-CM | POA: Diagnosis not present

## 2015-08-05 DIAGNOSIS — R933 Abnormal findings on diagnostic imaging of other parts of digestive tract: Secondary | ICD-10-CM

## 2015-08-05 DIAGNOSIS — M50323 Other cervical disc degeneration at C6-C7 level: Secondary | ICD-10-CM | POA: Diagnosis not present

## 2015-08-05 DIAGNOSIS — M62830 Muscle spasm of back: Secondary | ICD-10-CM | POA: Diagnosis not present

## 2015-08-05 DIAGNOSIS — M4012 Other secondary kyphosis, cervical region: Secondary | ICD-10-CM | POA: Diagnosis not present

## 2015-08-05 DIAGNOSIS — R1032 Left lower quadrant pain: Secondary | ICD-10-CM

## 2015-08-19 ENCOUNTER — Ambulatory Visit
Admission: RE | Admit: 2015-08-19 | Discharge: 2015-08-19 | Disposition: A | Discharge status: Home / Self Care | Payer: BLUE CROSS/BLUE SHIELD | Source: Ambulatory Visit | Attending: Gastroenterology | Admitting: Gastroenterology

## 2015-08-19 DIAGNOSIS — R1032 Left lower quadrant pain: Secondary | ICD-10-CM | POA: Diagnosis not present

## 2015-08-19 DIAGNOSIS — R933 Abnormal findings on diagnostic imaging of other parts of digestive tract: Secondary | ICD-10-CM

## 2015-08-19 MED ORDER — IOPAMIDOL (ISOVUE-300) INJECTION 61%
100.0000 mL | Freq: Once | INTRAVENOUS | Status: AC | PRN
Start: 2015-08-19 — End: 2015-08-19
  Administered 2015-08-19: 100 mL via INTRAVENOUS

## 2015-09-08 DIAGNOSIS — R102 Pelvic and perineal pain: Secondary | ICD-10-CM | POA: Diagnosis not present

## 2015-09-08 DIAGNOSIS — N898 Other specified noninflammatory disorders of vagina: Secondary | ICD-10-CM | POA: Diagnosis not present

## 2015-09-09 DIAGNOSIS — Z79899 Other long term (current) drug therapy: Secondary | ICD-10-CM | POA: Diagnosis not present

## 2015-09-09 DIAGNOSIS — M25561 Pain in right knee: Secondary | ICD-10-CM | POA: Diagnosis not present

## 2015-09-09 DIAGNOSIS — M2550 Pain in unspecified joint: Secondary | ICD-10-CM | POA: Diagnosis not present

## 2015-09-09 DIAGNOSIS — M545 Low back pain: Secondary | ICD-10-CM | POA: Diagnosis not present

## 2015-09-10 ENCOUNTER — Ambulatory Visit: Payer: Self-pay | Admitting: Family Medicine

## 2015-09-20 ENCOUNTER — Ambulatory Visit (INDEPENDENT_AMBULATORY_CARE_PROVIDER_SITE_OTHER): Payer: BLUE CROSS/BLUE SHIELD | Admitting: Family Medicine

## 2015-09-20 ENCOUNTER — Encounter: Payer: Self-pay | Admitting: Family Medicine

## 2015-09-20 VITALS — BP 124/80 | HR 64 | Temp 98.6°F | Ht 64.0 in | Wt 176.0 lb

## 2015-09-20 DIAGNOSIS — M542 Cervicalgia: Secondary | ICD-10-CM

## 2015-09-20 DIAGNOSIS — M533 Sacrococcygeal disorders, not elsewhere classified: Secondary | ICD-10-CM

## 2015-09-20 DIAGNOSIS — Z87898 Personal history of other specified conditions: Secondary | ICD-10-CM | POA: Diagnosis not present

## 2015-09-20 DIAGNOSIS — R1030 Lower abdominal pain, unspecified: Secondary | ICD-10-CM

## 2015-09-20 DIAGNOSIS — K5909 Other constipation: Secondary | ICD-10-CM

## 2015-09-20 MED ORDER — OMEPRAZOLE 20 MG PO CPDR: 20.0000 mg | DELAYED_RELEASE_CAPSULE | Freq: Every day | ORAL | 5 refills | Status: DC

## 2015-09-20 MED ORDER — NAPROXEN 500 MG PO TABS: 500.0000 mg | ORAL_TABLET | Freq: Two times a day (BID) | ORAL | 5 refills | Status: DC

## 2015-09-20 MED ORDER — LINACLOTIDE 290 MCG PO CAPS: 290.0000 ug | ORAL_CAPSULE | Freq: Every day | ORAL | 5 refills | Status: DC

## 2015-09-20 NOTE — Progress Notes (Addendum)
Kendra OchoaKristin Jimenez  MRN: [id number] DOB: 11/16/1965  Subjective:  HPI   The patient is a 50 year old female that presents as a new patient to establish care with the practice.  She has 3 main complaints and they are; Abdominal pain Neck pain  Tailbone pain.  She was in to see her gyn for IUD placement.  One week later she had pelvic pain, ultrasound was obtained and revealed bilateral ovarian cysts, IUD was removed at the patient's request.  On f/u she continued with pelvic pain, one cyst cleared and patient was told she had elevated amount of white cells on vaginal discharge, no treatment.    Pain continued and patient was seen again, all cysts clear and provider was unable to determine the status of the white cells in the discharge.  Patient at that time was referred to GI.  GI did colonoscopy and 5 pre-cancerous polyps were removed, CT was ordered.  CT neg except for stool, recommended Colace BID,Miralax daily and refer back to gyn.  Patient has had no relief from stool softeners and has been using MOM for the last 4-5 days.  Pain is on the left side more than the right.  Patient had Pap smear 1 week prior to the placement of her IUD.  The pap was abnormal and biopsies were done.  Biopsies were neg  Patient has had neck pain since December.  She has had x-rays and alignment with the chiropractor.  She was having numbness in her arms and fingers and since seeing chiropractor that has improved but the pain remains.   Tailbone pain-patient has had pain in the area of her tailbone since December with no known trauma.  She was referred to a rheumatology who only recommended she see a special type of physical therapist that would internally massage the tailbone for pain relief.  Patient has decided not to do this yet.    Patient Active Problem List   Diagnosis Date Noted  . SINUSITIS, ACUTE 01/19/2010  . CHEST PAIN, ATYPICAL 08/21/2008  . DEPRESSION 11/26/2007  . ALLERGIC RHINITIS 11/26/2007   . GERD 11/26/2007  . ECZEMA, ATOPIC 11/26/2007    Past Medical History:  Diagnosis Date  . Abnormal Papanicolaou smear of cervix   . Colon polyps   . Depression   . Gastric ulcer   . GERD (gastroesophageal reflux disease)   . Spon abortion w/o complication     Social History   Social History  . Marital status: Married    Spouse name: N/A  . Number of children: N/A  . Years of education: N/A   Occupational History  . Not on file.   Social History Main Topics  . Smoking status: Former Games developermoker  . Smokeless tobacco: Never Used  . Alcohol use Yes     Comment: 1 per month  . Drug use:     Types: Marijuana     Comment: college  . Sexual activity: Yes    Birth control/ protection: Condom   Other Topics Concern  . Not on file   Social History Narrative  . No narrative on file    Outpatient Medications Prior to Visit  Medication Sig Dispense Refill  . amoxicillin (AMOXIL) 500 MG capsule Take 2 capsules (1,000 mg total) by mouth 2 (two) times daily. 40 capsule 0   No facility-administered medications prior to visit.    Outpatient Encounter Prescriptions as of 09/20/2015  Medication Sig  . buPROPion (WELLBUTRIN XL) 300 MG 24 hr tablet  Take 300 mg by mouth daily.  . [DISCONTINUED] amoxicillin (AMOXIL) 500 MG capsule Take 2 capsules (1,000 mg total) by mouth 2 (two) times daily.   No facility-administered encounter medications on file as of 09/20/2015.    Allergies  Allergen Reactions  . Codeine     REACTION: Itching  . Povidone Iodine Rash    Pt. States had a reaction to topical iodine when giving blood. 15 years ago//kp    Review of Systems  Constitutional: Negative.   HENT: Negative.   Eyes: Negative.   Respiratory: Negative.   Cardiovascular: Negative.   Gastrointestinal: Positive for abdominal pain and constipation.  Genitourinary: Positive for frequency.  Musculoskeletal: Positive for back pain and neck pain (interferes with sleep).  Skin: Negative.     Neurological: Negative.   Endo/Heme/Allergies: Negative.   Psychiatric/Behavioral: Negative.    Objective:  BP 124/80   Pulse 64   Temp 98.6 F (37 C) (Oral)   Ht 5\' 4"  (1.626 m)   Wt 176 lb (79.8 kg)   BMI 30.21 kg/m   Physical Exam  Constitutional: She is oriented to person, place, and time and well-developed, well-nourished, and in no distress.  HENT:  Head: Normocephalic and atraumatic.  Right Ear: External ear normal.  Left Ear: External ear normal.  Nose: Nose normal.  Mouth/Throat: Oropharynx is clear and moist.  Eyes: Conjunctivae are normal. Pupils are equal, round, and reactive to light.  Neck: Neck supple.  Limited ROM in all directions  Cardiovascular: Normal rate, regular rhythm, normal heart sounds and intact distal pulses.   Pulmonary/Chest: Effort normal and breath sounds normal.  Abdominal: Soft. She exhibits no distension and no mass. There is no tenderness. There is no rebound and no guarding.  Mildly decreased bowel sounds  Musculoskeletal: Normal range of motion. She exhibits no edema, tenderness or deformity.  She appears to have some mild limited range of motion with lateral rotation of her neck  Neurological: She is alert and oriented to person, place, and time. Gait normal.  Skin: Skin is warm and dry.  Psychiatric: Mood, memory, affect and judgment normal.    Assessment and Plan :  1. Other constipation I'm not sure what to make of her constellation of symptoms. She has had GI and GYN workup. - linaclotide (LINZESS) 290 MCG CAPS capsule; Take 1 capsule (290 mcg total) by mouth daily before breakfast.  Dispense: 30 capsule; Refill: 5  2. Neck pain  - naproxen (NAPROSYN) 500 MG tablet; Take 1 tablet (500 mg total) by mouth 2 (two) times daily with a meal.  Dispense: 30 tablet; Refill: 5  3. Lower abdominal pain Does not appear to be a gynecologic issue. Patient feels it may be related to her IUD. - linaclotide (LINZESS) 290 MCG CAPS capsule;  Take 1 capsule (290 mcg total) by mouth daily before breakfast.  Dispense: 30 capsule; Refill: 5  4. Coccyxdynia  - naproxen (NAPROSYN) 500 MG tablet; Take 1 tablet (500 mg total) by mouth 2 (two) times daily with a meal.  Dispense: 30 tablet; Refill: 5  5. History of ulcer disease She actually had esophageal erosions as a young lady. We'll watch carefully with this. Will protect with PPI - naproxen (NAPROSYN) 500 MG tablet; Take 1 tablet (500 mg total) by mouth 2 (two) times daily with a meal.  Dispense: 30 tablet; Refill: 5    Patient was seen and examined by Dr. Gerlene Burdock L. Wendelyn Breslow and the note was scribed by Janey Greaser, RMA.  Julieanne Manson MD [facility] 09/20/2015 3:47 PM

## 2015-09-21 ENCOUNTER — Ambulatory Visit
Admission: RE | Admit: 2015-09-21 | Discharge: 2015-09-21 | Disposition: A | Discharge status: Home / Self Care | Payer: BLUE CROSS/BLUE SHIELD | Source: Ambulatory Visit | Attending: Family Medicine | Admitting: Family Medicine

## 2015-09-21 DIAGNOSIS — M47812 Spondylosis without myelopathy or radiculopathy, cervical region: Secondary | ICD-10-CM | POA: Diagnosis not present

## 2015-09-21 DIAGNOSIS — M542 Cervicalgia: Secondary | ICD-10-CM | POA: Insufficient documentation

## 2015-09-24 ENCOUNTER — Telehealth: Payer: Self-pay

## 2015-09-24 NOTE — Telephone Encounter (Signed)
Patient advised of x-ray. sd

## 2015-09-28 ENCOUNTER — Telehealth: Payer: Self-pay

## 2015-09-28 NOTE — Telephone Encounter (Signed)
LMTCB 09/28/2015  Thanks,   -Charmika Macdonnell  

## 2015-09-28 NOTE — Telephone Encounter (Signed)
Patient returned Laura's call.  Thanks, Fortune Brandsteri

## 2015-09-28 NOTE — Telephone Encounter (Signed)
lmtcb Ranard Harte Drozdowski, CMA  

## 2015-09-28 NOTE — Telephone Encounter (Signed)
Pt is returning call.  UX#[phone]/ZDCB#[phone]/MW

## 2015-09-28 NOTE — Telephone Encounter (Signed)
-----   Message from Maple Hudsonichard L Gilbert Jr., MD sent at 09/21/2015  2:53 PM EDT ----- Arthritis and mild DDD of neck.

## 2015-09-29 NOTE — Telephone Encounter (Signed)
Pt advised-aa 

## 2015-10-04 ENCOUNTER — Ambulatory Visit (INDEPENDENT_AMBULATORY_CARE_PROVIDER_SITE_OTHER): Payer: BLUE CROSS/BLUE SHIELD | Admitting: Family Medicine

## 2015-10-04 ENCOUNTER — Ambulatory Visit: Payer: BLUE CROSS/BLUE SHIELD | Admitting: Family Medicine

## 2015-10-04 VITALS — BP 104/72 | HR 62 | Temp 98.4°F | Resp 14 | Wt 179.0 lb

## 2015-10-04 DIAGNOSIS — R1030 Lower abdominal pain, unspecified: Secondary | ICD-10-CM

## 2015-10-04 DIAGNOSIS — M542 Cervicalgia: Secondary | ICD-10-CM

## 2015-10-04 DIAGNOSIS — K5909 Other constipation: Secondary | ICD-10-CM | POA: Diagnosis not present

## 2015-10-04 MED ORDER — LINACLOTIDE 72 MCG PO CAPS: 72.0000 ug | ORAL_CAPSULE | Freq: Every day | ORAL | 5 refills | Status: DC

## 2015-10-04 NOTE — Progress Notes (Signed)
Subjective:  HPI  Patient is here for 2 week follow up on constipation, abdominal pain and neck pain. She was started on Linzess and states if she takes this medication she has a bowel movement but without it she does not.  Abdominal pain is not as constant, she started her menstrual cycle and wonders if some of this was related. She feels ok today but she has not eaten anything yet and did not take Juice plus vitamin today. Neck pain is better with taking naproxen.  Prior to Admission medications   Medication Sig Start Date End Date Taking? Authorizing Provider  buPROPion (WELLBUTRIN XL) 300 MG 24 hr tablet Take 300 mg by mouth daily.   Yes Historical Provider, MD  linaclotide Karlene Einstein(LINZESS) 290 MCG CAPS capsule Take 1 capsule (290 mcg total) by mouth daily before breakfast. 09/20/15  Yes Maple Hudsonichard L Kendrew Paci Jr., MD  naproxen (NAPROSYN) 500 MG tablet Take 1 tablet (500 mg total) by mouth 2 (two) times daily with a meal. 09/20/15  Yes Maple Hudsonichard L Savyon Loken Jr., MD  omeprazole (PRILOSEC) 20 MG capsule Take 1 capsule (20 mg total) by mouth daily. 09/20/15  Yes Tesla Keeler Hulen ShoutsL Xzavier Swinger Jr., MD  Nutritional Supplements (JUICE PLUS FIBRE PO) Take by mouth daily.    Historical Provider, MD    Patient Active Problem List   Diagnosis Date Noted  . SINUSITIS, ACUTE 01/19/2010  . CHEST PAIN, ATYPICAL 08/21/2008  . DEPRESSION 11/26/2007  . ALLERGIC RHINITIS 11/26/2007  . GERD 11/26/2007  . ECZEMA, ATOPIC 11/26/2007    Past Medical History:  Diagnosis Date  . Abnormal Papanicolaou smear of cervix   . Colon polyps   . Depression   . Gastric ulcer   . GERD (gastroesophageal reflux disease)   . Spon abortion w/o complication     Social History   Social History  . Marital status: Married    Spouse name: N/A  . Number of children: N/A  . Years of education: N/A   Occupational History  . Not on file.   Social History Main Topics  . Smoking status: Former Games developermoker  . Smokeless tobacco: Never Used  .  Alcohol use Yes     Comment: 1 per month  . Drug use:     Types: Marijuana     Comment: college  . Sexual activity: Yes    Birth control/ protection: Condom   Other Topics Concern  . Not on file   Social History Narrative  . No narrative on file    Allergies  Allergen Reactions  . Codeine     REACTION: Itching  . Povidone Iodine Rash    Pt. States had a reaction to topical iodine when giving blood. 15 years ago//kp    Review of Systems  Constitutional: Negative.   Eyes: Negative.   Respiratory: Negative.   Cardiovascular: Negative.   Gastrointestinal: Positive for abdominal pain and constipation.  Musculoskeletal: Negative.   Skin: Negative.   Neurological: Negative.   Endo/Heme/Allergies: Negative.   Psychiatric/Behavioral: Negative.     Immunization History  Administered Date(s) Administered  . Influenza Split 01/11/2011, 11/23/2011  . Influenza Whole 11/26/2007, 01/12/2009, 12/21/2009  . Influenza,inj,Quad PF,36+ Mos 12/25/2012  . Td 02/13/2002   Objective:  BP 104/72   Pulse 62   Temp 98.4 F (36.9 C)   Resp 14   Wt 179 lb (81.2 kg)   LMP 09/07/2015   BMI 30.73 kg/m   Physical Exam  Constitutional: She is oriented to person, place, and  time and well-developed, well-nourished, and in no distress.  HENT:  Head: Normocephalic and atraumatic.  Right Ear: External ear normal.  Left Ear: External ear normal.  Nose: Nose normal.  Eyes: Conjunctivae are normal. Pupils are equal, round, and reactive to light.  Neck: Normal range of motion. Neck supple.  Cardiovascular: Normal rate, regular rhythm, normal heart sounds and intact distal pulses.   No murmur heard. Pulmonary/Chest: Effort normal and breath sounds normal. No respiratory distress. She has no wheezes.  Abdominal: Soft. She exhibits no distension. There is no tenderness. There is no rebound.  Musculoskeletal: She exhibits no edema or tenderness.  Neurological: She is alert and oriented to  person, place, and time. Gait normal.  Skin: Skin is warm and dry.  Psychiatric: Mood, memory, affect and judgment normal.    Lab Results  Component Value Date   GLUCOSE 92 12/02/2007   CHOL 141 12/02/2007   TRIG 78 12/02/2007   HDL 34.7 (L) 12/02/2007   LDLCALC 91 12/02/2007    CMP     Component Value Date/Time   NA 140 12/02/2007 1036   K 4.2 12/02/2007 1036   CL 104 12/02/2007 1036   CO2 29 12/02/2007 1036   GLUCOSE 92 12/02/2007 1036   BUN 12 12/02/2007 1036   CREATININE 0.8 12/02/2007 1036   CALCIUM 9.0 12/02/2007 1036   PROT 7.0 12/02/2007 1036   ALBUMIN 3.8 12/02/2007 1036   AST 17 12/02/2007 1036   ALT 15 12/02/2007 1036   ALKPHOS 68 12/02/2007 1036   BILITOT 0.8 12/02/2007 1036   GFRNONAA 84 12/02/2007 1036   GFRAA 102 12/02/2007 1036    Assessment and Plan :  1. Other constipation Better but bowels are diarrhea like. Will decrease Linzzess dose, advised patient to take daily. Re check on the next visit.  2. Neck pain Better. Advised patient to continue taking Naproxen through labor day and follow.  3. Lower abdominal pain Better, could have been cycle related. Also discussed trying not to take Juice plus vitamin and see if she gets any improvement. Overall she is feeling much better. I think she will continue improved. We'll continue to treat the  Constipation but I think the other issues will probably be self-limited Patient was seen and examined by Dr. Bosie Closichard L Qais Jowers and note was scribed by Samara DeistAnastasiya Aleksandrova, RMA.  I have done the exam and reviewed the above chart and it is accurate to the best of my knowledge.    Julieanne Mansonichard Odessia Asleson MD [facility] 10/04/2015 10:02 AM

## 2015-10-07 ENCOUNTER — Encounter: Payer: Self-pay | Admitting: *Deleted

## 2015-10-12 ENCOUNTER — Encounter: Payer: Self-pay | Admitting: Family Medicine

## 2015-10-12 DIAGNOSIS — R102 Pelvic and perineal pain: Secondary | ICD-10-CM | POA: Diagnosis not present

## 2015-10-21 DIAGNOSIS — N63 Unspecified lump in breast: Secondary | ICD-10-CM | POA: Diagnosis not present

## 2015-10-21 LAB — HM MAMMOGRAPHY: HM MAMMO: NORMAL (ref 0–4)

## 2015-11-02 ENCOUNTER — Encounter: Payer: Self-pay | Admitting: Family Medicine

## 2015-11-02 DIAGNOSIS — R1032 Left lower quadrant pain: Secondary | ICD-10-CM | POA: Diagnosis not present

## 2015-11-02 DIAGNOSIS — K5900 Constipation, unspecified: Secondary | ICD-10-CM | POA: Diagnosis not present

## 2015-11-16 DIAGNOSIS — E669 Obesity, unspecified: Secondary | ICD-10-CM | POA: Diagnosis not present

## 2015-11-16 DIAGNOSIS — M533 Sacrococcygeal disorders, not elsewhere classified: Secondary | ICD-10-CM | POA: Diagnosis not present

## 2015-11-16 DIAGNOSIS — K5904 Chronic idiopathic constipation: Secondary | ICD-10-CM | POA: Diagnosis not present

## 2015-11-30 ENCOUNTER — Ambulatory Visit (INDEPENDENT_AMBULATORY_CARE_PROVIDER_SITE_OTHER): Payer: BLUE CROSS/BLUE SHIELD | Admitting: Family Medicine

## 2015-11-30 VITALS — BP 122/74 | HR 62 | Temp 98.6°F | Resp 16 | Wt 178.0 lb

## 2015-11-30 DIAGNOSIS — M542 Cervicalgia: Secondary | ICD-10-CM

## 2015-11-30 DIAGNOSIS — R1030 Lower abdominal pain, unspecified: Secondary | ICD-10-CM | POA: Diagnosis not present

## 2015-11-30 DIAGNOSIS — Z23 Encounter for immunization: Secondary | ICD-10-CM

## 2015-11-30 MED ORDER — CARISOPRODOL 350 MG PO TABS: 350.0000 mg | ORAL_TABLET | Freq: Three times a day (TID) | ORAL | 3 refills | Status: DC

## 2015-11-30 NOTE — Progress Notes (Signed)
Kendra OchoaKristin Jimenez  MRN: [id number] DOB: 06-07-1965  Subjective:  HPI  Patient is here to follow up on the pain she is having. Last visit was in August. She is still having left abdominal pain, neck pain and pain in tail bone area. She has been taking Naproxen still- she is on her 3rd bottle, still takes Linzess and is having a lot of bowel movements. She has seen her gynecologist- Dr Ernestina PennaFogleman in [place] and [place] [facility] in [place] and they both said nothing was wrong internally that they could find and thought this may be all musculoskeletal and also mentioned physical therapy possibly.  Patient Active Problem List   Diagnosis Date Noted  . SINUSITIS, ACUTE 01/19/2010  . CHEST PAIN, ATYPICAL 08/21/2008  . DEPRESSION 11/26/2007  . ALLERGIC RHINITIS 11/26/2007  . GERD 11/26/2007  . ECZEMA, ATOPIC 11/26/2007    Past Medical History:  Diagnosis Date  . Abnormal Papanicolaou smear of cervix   . Colon polyps   . Depression   . Gastric ulcer   . GERD (gastroesophageal reflux disease)   . Spon abortion w/o complication     Social History   Social History  . Marital status: Married    Spouse name: N/A  . Number of children: N/A  . Years of education: N/A   Occupational History  . Not on file.   Social History Main Topics  . Smoking status: Former Games developermoker  . Smokeless tobacco: Never Used  . Alcohol use Yes     Comment: 1 per month  . Drug use:     Types: Marijuana     Comment: college  . Sexual activity: Yes    Birth control/ protection: Condom   Other Topics Concern  . Not on file   Social History Narrative  . No narrative on file    Outpatient Encounter Prescriptions as of 11/30/2015  Medication Sig Note  . buPROPion (WELLBUTRIN XL) 300 MG 24 hr tablet Take 300 mg by mouth daily.   Marland Kitchen. linaclotide (LINZESS) 72 MCG capsule Take 1 capsule (72 mcg total) by mouth daily before breakfast.   . naproxen (NAPROSYN) 500 MG tablet Take 1 tablet (500 mg total) by mouth 2  (two) times daily with a meal.   . Nutritional Supplements (JUICE PLUS FIBRE PO) Take by mouth daily. 10/04/2015: Did not take it today  . omeprazole (PRILOSEC) 20 MG capsule Take 1 capsule (20 mg total) by mouth daily.    No facility-administered encounter medications on file as of 11/30/2015.     Allergies  Allergen Reactions  . Codeine     REACTION: Itching  . Povidone Iodine Rash    Pt. States had a reaction to topical iodine when giving blood. 15 years ago//kp    Review of Systems  Constitutional: Negative.   HENT: Negative.   Eyes: Negative.   Respiratory: Negative.   Cardiovascular: Negative.   Gastrointestinal: Positive for abdominal pain. Negative for nausea and vomiting.  Musculoskeletal: Positive for back pain, joint pain and neck pain.  Skin: Negative.   Neurological: Negative.   Endo/Heme/Allergies: Negative.   Psychiatric/Behavioral: Negative.    Objective:  BP 122/74   Pulse 62   Temp 98.6 F (37 C)   Resp 16   Wt 178 lb (80.7 kg)   BMI 30.55 kg/m   Physical Exam  Constitutional: She is oriented to person, place, and time and well-developed, well-nourished, and in no distress.  HENT:  Head: Normocephalic and atraumatic.  Right Ear: External  ear normal.  Left Ear: External ear normal.  Nose: Nose normal.  Mouth/Throat: Oropharynx is clear and moist.  Eyes: Conjunctivae are normal. Pupils are equal, round, and reactive to light.  Neck: Decreased range of motion present. No thyromegaly present.  Cardiovascular: Normal rate, regular rhythm, normal heart sounds and intact distal pulses.   No murmur heard. Pulmonary/Chest: Effort normal and breath sounds normal. No respiratory distress. She has no wheezes.  Abdominal: Soft. There is tenderness (mildly tender in the right and lower quadrants, no tenderness in suprabupic area.). There is no rebound and no guarding.  Neurological: She is alert and oriented to person, place, and time.  Skin: Skin is warm and  dry.  No rash Mild patches of vitiligo on arms.   Psychiatric: Mood, memory, affect and judgment normal.    Assessment and Plan :  1. Lower abdominal pain Question etiology. GI and gyn work up has been ok. Could be a lactose intolerance issue-discussed with patient what she can try to avoid and see if symptoms improve. Will try Soma in case spasms is causing the pain. Re check on the next visit. Treating his abdominal wall/muscular pain is all other workup has been negative.  2. Neck pain Affecting patient's daily life. Will try Soma. Re check on the next visit.  HPI, Exam and A&P transcribed under direction and in the presence of Julieanne Manson, MD. I have done the exam and reviewed the chart and it is accurate to the best of my knowledge. Julieanne Manson M.D. [facility]

## 2015-12-23 ENCOUNTER — Ambulatory Visit (INDEPENDENT_AMBULATORY_CARE_PROVIDER_SITE_OTHER): Payer: BLUE CROSS/BLUE SHIELD | Admitting: Family Medicine

## 2015-12-23 VITALS — BP 118/72 | HR 60 | Temp 98.9°F | Resp 14 | Wt 177.0 lb

## 2015-12-23 DIAGNOSIS — M2550 Pain in unspecified joint: Secondary | ICD-10-CM

## 2015-12-23 DIAGNOSIS — K5909 Other constipation: Secondary | ICD-10-CM | POA: Diagnosis not present

## 2015-12-23 DIAGNOSIS — R1030 Lower abdominal pain, unspecified: Secondary | ICD-10-CM

## 2015-12-23 DIAGNOSIS — M542 Cervicalgia: Secondary | ICD-10-CM | POA: Diagnosis not present

## 2015-12-23 NOTE — Progress Notes (Signed)
Luiz OchoaKristin Taborda  MRN: [id number] DOB: [birth year]  Subjective:  HPI   Patient is here to follow up on neck pain. Last visit was on 10/17 and Tresa GarterSoma was started. She has used Herbalistoma mainly at bedtime because she gets too drowsy during the day to be able to function with work. Neck pain began in December 2016. She has seen chiropractor over the past year, recently went to massage therapy which helped until next day. Naproxen does help when she takes it but does not want to have to take this all the time. The past 2 weeks the pain has gotten worse. She has been using heating pad in the morning and in the evening. It seems she is very stiff when she wakes up in the morning. Neck xray was done on 09/21/15 and showed degenerative changes without acute abnormality. Symptoms are not as severe as they were in the summer but the neck pain has persisted and worsened.  Patient Active Problem List   Diagnosis Date Noted  . SINUSITIS, ACUTE 01/19/2010  . CHEST PAIN, ATYPICAL 08/21/2008  . DEPRESSION 11/26/2007  . ALLERGIC RHINITIS 11/26/2007  . GERD 11/26/2007  . ECZEMA, ATOPIC 11/26/2007    Past Medical History:  Diagnosis Date  . Abnormal Papanicolaou smear of cervix   . Colon polyps   . Depression   . Gastric ulcer   . GERD (gastroesophageal reflux disease)   . Spon abortion w/o complication     Social History   Social History  . Marital status: Married    Spouse name: N/A  . Number of children: N/A  . Years of education: N/A   Occupational History  . Not on file.   Social History Main Topics  . Smoking status: Former Games developermoker  . Smokeless tobacco: Never Used  . Alcohol use Yes     Comment: 1 per month  . Drug use:     Types: Marijuana     Comment: college  . Sexual activity: Yes    Birth control/ protection: Condom   Other Topics Concern  . Not on file   Social History Narrative  . No narrative on file    Outpatient Encounter Prescriptions as of 12/23/2015  Medication Sig  Note  . buPROPion (WELLBUTRIN XL) 300 MG 24 hr tablet Take 300 mg by mouth daily.   . carisoprodol (SOMA) 350 MG tablet Take 1 tablet (350 mg total) by mouth 3 (three) times daily.   Marland Kitchen. linaclotide (LINZESS) 72 MCG capsule Take 1 capsule (72 mcg total) by mouth daily before breakfast.   . naproxen (NAPROSYN) 500 MG tablet Take 1 tablet (500 mg total) by mouth 2 (two) times daily with a meal.   . Nutritional Supplements (JUICE PLUS FIBRE PO) Take by mouth daily. 10/04/2015: Did not take it today  . omeprazole (PRILOSEC) 20 MG capsule Take 1 capsule (20 mg total) by mouth daily.    No facility-administered encounter medications on file as of 12/23/2015.     Allergies  Allergen Reactions  . Codeine     REACTION: Itching  . Povidone Iodine Rash    Pt. States had a reaction to topical iodine when giving blood. 15 years ago//kp    Review of Systems  Constitutional: Negative.   Respiratory: Negative.   Cardiovascular: Negative.   Gastrointestinal: Positive for abdominal pain.  Musculoskeletal: Positive for back pain, myalgias and neck pain.       Stiffness, tightness in the hamstring area.  Skin: Negative.   Neurological: Positive  for headaches (frequent off and on lately).  Endo/Heme/Allergies: Negative.   Psychiatric/Behavioral: Negative.     Objective:  BP 118/72   Pulse 60   Temp 98.9 F (37.2 C)   Resp 14   Wt 177 lb (80.3 kg)   BMI 30.38 kg/m   Physical Exam  Constitutional: She is oriented to person, place, and time and well-developed, well-nourished, and in no distress.  HENT:  Head: Normocephalic and atraumatic.  Eyes: Conjunctivae are normal. Pupils are equal, round, and reactive to light.  Neck: Neck supple.  Pain in the neck with Spurling maneuver  Cardiovascular: Normal rate, regular rhythm, normal heart sounds and intact distal pulses.   No murmur heard. Pulmonary/Chest: Effort normal and breath sounds normal. No respiratory distress. She has no wheezes.    Musculoskeletal: She exhibits no edema or tenderness.  Neurological: She is alert and oriented to person, place, and time. She is not agitated and not disoriented. She displays no weakness. No cranial nerve deficit. She exhibits normal muscle tone. Gait normal. Coordination and gait normal.  Psychiatric: Mood, memory, affect and judgment normal.    Assessment and Plan :  1. Neck pain Will check labs. Not sure exactly etiology of this. Possible autoimmune disorder. Will refer to Dr Bock-rheumatologist. Pending results.  2. Lower abdominal pain She has been seen by GYN and gastroenterology and has Been completely cleared for this pain. This includes CT of the abdomen and pelvis which was obtained July 6. 3. Other constipation Try to stop Linzess for 1 week and see if symptoms are better or worse and then proceed from there if need to restart LInzess. Bowels have been more regular with the medication.  4. Arthralgia of multiple sites See plan above. Symptoms are unclear for etiology of this time. Autoimmune makes the most sense. 5. Depression, mild Control with Wellbutrin. HPI, Exam and A&P transcribed under direction and in the presence of Julieanne Mansonichard Gilbert, MD. I have done the exam and reviewed the chart and it is accurate to the best of my knowledge. [employer]  technology has been used and  any errors in dictation or transcription are unintentional. Julieanne Mansonichard Gilbert M.D. [facility]

## 2015-12-24 ENCOUNTER — Telehealth: Payer: Self-pay

## 2015-12-24 DIAGNOSIS — M2550 Pain in unspecified joint: Secondary | ICD-10-CM

## 2015-12-24 NOTE — Telephone Encounter (Signed)
Patient has been advised and referral ordered. KW

## 2015-12-24 NOTE — Telephone Encounter (Signed)
-----   Message from Maple Hudsonichard L Gilbert Jr., MD sent at 12/24/2015 11:46 AM EST ----- Labs okay but ANA is positive. This is a very nonspecific test would follow through with the referral to rheumatology that we had discussed at the time of the visit. Dr. Gavin PottersKernodle  or his partner Dr. Jaci LazierBoch..Marland Kitchen

## 2015-12-28 LAB — COMPREHENSIVE METABOLIC PANEL
A/G RATIO: 1.7 (ref 1.2–2.2)
ALT: 18 IU/L (ref 0–32)
AST: 18 IU/L (ref 0–40)
Albumin: 4.7 g/dL (ref 3.5–5.5)
Alkaline Phosphatase: 61 IU/L (ref 39–117)
BUN/Creatinine Ratio: 16 (ref 9–23)
BUN: 16 mg/dL (ref 6–24)
Bilirubin Total: 0.4 mg/dL (ref 0.0–1.2)
CALCIUM: 9.3 mg/dL (ref 8.7–10.2)
CO2: 25 mmol/L (ref 18–29)
CREATININE: 0.97 mg/dL (ref 0.57–1.00)
Chloride: 99 mmol/L (ref 96–106)
GFR, EST AFRICAN AMERICAN: 80 mL/min/{1.73_m2} (ref >59)
GFR, EST NON AFRICAN AMERICAN: 69 mL/min/{1.73_m2} (ref >59)
GLUCOSE: 83 mg/dL (ref 65–99)
Globulin, Total: 2.8 g/dL (ref 1.5–4.5)
Potassium: 4.7 mmol/L (ref 3.5–5.2)
Sodium: 139 mmol/L (ref 134–144)
TOTAL PROTEIN: 7.5 g/dL (ref 6.0–8.5)

## 2015-12-28 LAB — CBC WITH DIFFERENTIAL/PLATELET
BASOS: 0 %
Basophils Absolute: 0 10*3/uL (ref 0.0–0.2)
EOS (ABSOLUTE): 0.1 10*3/uL (ref 0.0–0.4)
Eos: 1 %
Hematocrit: 38.4 % (ref 34.0–46.6)
Hemoglobin: 13.2 g/dL (ref 11.1–15.9)
IMMATURE GRANS (ABS): 0 10*3/uL (ref 0.0–0.1)
IMMATURE GRANULOCYTES: 0 %
LYMPHS: 18 %
Lymphocytes Absolute: 1.4 10*3/uL (ref 0.7–3.1)
MCH: 29.8 pg (ref 26.6–33.0)
MCHC: 34.4 g/dL (ref 31.5–35.7)
MCV: 87 fL (ref 79–97)
Monocytes Absolute: 0.4 10*3/uL (ref 0.1–0.9)
Monocytes: 5 %
NEUTROS PCT: 76 %
Neutrophils Absolute: 5.9 10*3/uL (ref 1.4–7.0)
PLATELETS: 271 10*3/uL (ref 150–379)
RBC: 4.43 x10E6/uL (ref 3.77–5.28)
RDW: 13.3 % (ref 12.3–15.4)
WBC: 7.8 10*3/uL (ref 3.4–10.8)

## 2015-12-28 LAB — ANA W/REFLEX: ANA: POSITIVE — AB

## 2015-12-28 LAB — ENA+DNA/DS+SJORGEN'S
DSDNA AB: 10 [IU]/mL — AB (ref 0–9)
ENA RNP AB: 1.4 AI — AB (ref 0.0–0.9)
ENA SM Ab Ser-aCnc: <0.2 AI (ref 0.0–0.9)
ENA SSA (RO) Ab: <0.2 AI (ref 0.0–0.9)

## 2015-12-28 LAB — TSH: TSH: 1.15 u[IU]/mL (ref 0.450–4.500)

## 2015-12-28 LAB — HLA-B27 ANTIGEN: HLA B27: NEGATIVE

## 2015-12-28 LAB — SEDIMENTATION RATE: SED RATE: 3 mm/h (ref 0–32)

## 2015-12-28 LAB — CK: CK TOTAL: 79 U/L (ref 24–173)

## 2016-01-11 DIAGNOSIS — M545 Low back pain: Secondary | ICD-10-CM | POA: Diagnosis not present

## 2016-01-11 DIAGNOSIS — M47816 Spondylosis without myelopathy or radiculopathy, lumbar region: Secondary | ICD-10-CM | POA: Diagnosis not present

## 2016-01-11 DIAGNOSIS — G8929 Other chronic pain: Secondary | ICD-10-CM | POA: Diagnosis not present

## 2016-01-11 DIAGNOSIS — M5030 Other cervical disc degeneration, unspecified cervical region: Secondary | ICD-10-CM | POA: Diagnosis not present

## 2016-01-11 DIAGNOSIS — M461 Sacroiliitis, not elsewhere classified: Secondary | ICD-10-CM | POA: Diagnosis not present

## 2016-01-11 DIAGNOSIS — R768 Other specified abnormal immunological findings in serum: Secondary | ICD-10-CM | POA: Diagnosis not present

## 2016-01-12 ENCOUNTER — Ambulatory Visit: Payer: BLUE CROSS/BLUE SHIELD | Admitting: Family Medicine

## 2016-02-02 DIAGNOSIS — M791 Myalgia: Secondary | ICD-10-CM | POA: Diagnosis not present

## 2016-02-02 DIAGNOSIS — R768 Other specified abnormal immunological findings in serum: Secondary | ICD-10-CM | POA: Diagnosis not present

## 2016-02-02 DIAGNOSIS — M5030 Other cervical disc degeneration, unspecified cervical region: Secondary | ICD-10-CM | POA: Diagnosis not present

## 2016-03-15 DIAGNOSIS — M9901 Segmental and somatic dysfunction of cervical region: Secondary | ICD-10-CM | POA: Diagnosis not present

## 2016-03-15 DIAGNOSIS — M5032 Other cervical disc degeneration, mid-cervical region, unspecified level: Secondary | ICD-10-CM | POA: Diagnosis not present

## 2016-03-15 DIAGNOSIS — M47812 Spondylosis without myelopathy or radiculopathy, cervical region: Secondary | ICD-10-CM | POA: Diagnosis not present

## 2016-03-15 DIAGNOSIS — M542 Cervicalgia: Secondary | ICD-10-CM | POA: Diagnosis not present

## 2016-03-20 DIAGNOSIS — M9901 Segmental and somatic dysfunction of cervical region: Secondary | ICD-10-CM | POA: Diagnosis not present

## 2016-03-20 DIAGNOSIS — M542 Cervicalgia: Secondary | ICD-10-CM | POA: Diagnosis not present

## 2016-03-20 DIAGNOSIS — M5032 Other cervical disc degeneration, mid-cervical region, unspecified level: Secondary | ICD-10-CM | POA: Diagnosis not present

## 2016-03-20 DIAGNOSIS — M47812 Spondylosis without myelopathy or radiculopathy, cervical region: Secondary | ICD-10-CM | POA: Diagnosis not present

## 2016-03-22 DIAGNOSIS — M9901 Segmental and somatic dysfunction of cervical region: Secondary | ICD-10-CM | POA: Diagnosis not present

## 2016-03-22 DIAGNOSIS — M542 Cervicalgia: Secondary | ICD-10-CM | POA: Diagnosis not present

## 2016-03-22 DIAGNOSIS — M5032 Other cervical disc degeneration, mid-cervical region, unspecified level: Secondary | ICD-10-CM | POA: Diagnosis not present

## 2016-03-22 DIAGNOSIS — M47812 Spondylosis without myelopathy or radiculopathy, cervical region: Secondary | ICD-10-CM | POA: Diagnosis not present

## 2016-03-23 DIAGNOSIS — Z1151 Encounter for screening for human papillomavirus (HPV): Secondary | ICD-10-CM | POA: Diagnosis not present

## 2016-03-23 DIAGNOSIS — R87619 Unspecified abnormal cytological findings in specimens from cervix uteri: Secondary | ICD-10-CM | POA: Diagnosis not present

## 2016-03-23 DIAGNOSIS — Z01419 Encounter for gynecological examination (general) (routine) without abnormal findings: Secondary | ICD-10-CM | POA: Diagnosis not present

## 2016-03-23 DIAGNOSIS — Z6830 Body mass index (BMI) 30.0-30.9, adult: Secondary | ICD-10-CM | POA: Diagnosis not present

## 2016-03-23 LAB — HM PAP SMEAR: HM PAP: NEGATIVE

## 2016-03-27 DIAGNOSIS — M9901 Segmental and somatic dysfunction of cervical region: Secondary | ICD-10-CM | POA: Diagnosis not present

## 2016-03-27 DIAGNOSIS — M47812 Spondylosis without myelopathy or radiculopathy, cervical region: Secondary | ICD-10-CM | POA: Diagnosis not present

## 2016-03-27 DIAGNOSIS — M542 Cervicalgia: Secondary | ICD-10-CM | POA: Diagnosis not present

## 2016-03-27 DIAGNOSIS — M5032 Other cervical disc degeneration, mid-cervical region, unspecified level: Secondary | ICD-10-CM | POA: Diagnosis not present

## 2016-03-30 DIAGNOSIS — M5032 Other cervical disc degeneration, mid-cervical region, unspecified level: Secondary | ICD-10-CM | POA: Diagnosis not present

## 2016-03-30 DIAGNOSIS — M47812 Spondylosis without myelopathy or radiculopathy, cervical region: Secondary | ICD-10-CM | POA: Diagnosis not present

## 2016-03-30 DIAGNOSIS — M542 Cervicalgia: Secondary | ICD-10-CM | POA: Diagnosis not present

## 2016-03-30 DIAGNOSIS — M9901 Segmental and somatic dysfunction of cervical region: Secondary | ICD-10-CM | POA: Diagnosis not present

## 2016-03-31 ENCOUNTER — Encounter: Payer: Self-pay | Admitting: Family Medicine

## 2016-03-31 ENCOUNTER — Ambulatory Visit (INDEPENDENT_AMBULATORY_CARE_PROVIDER_SITE_OTHER): Payer: BLUE CROSS/BLUE SHIELD | Admitting: Family Medicine

## 2016-03-31 VITALS — BP 124/72 | HR 80 | Temp 101.3°F | Resp 16 | Wt 183.0 lb

## 2016-03-31 DIAGNOSIS — J020 Streptococcal pharyngitis: Secondary | ICD-10-CM

## 2016-03-31 LAB — POCT RAPID STREP A (OFFICE): RAPID STREP A SCREEN: POSITIVE — AB

## 2016-03-31 MED ORDER — AMOXICILLIN 875 MG PO TABS: 875.0000 mg | ORAL_TABLET | Freq: Two times a day (BID) | ORAL | 0 refills | Status: DC

## 2016-03-31 NOTE — Progress Notes (Signed)
Subjective:     Patient ID: Kendra OchoaKristin Jimenez, female   DOB: May 22, 1965, 51 y.o.   MRN: [id number]  HPI  Chief Complaint  Patient presents with  . Sore Throat    Since this morning. Pt's daughter had sore throat and fever over the weekend, but daughter was soon feeling better so she was not tested for strep. Pt has fever of 101.3 in office. Pt denies cough. Pt is c/o chills, sweats, aching and some chest tightness. Pt has tried ibuprofen and throat lozenges, with some relief.  + flu shot  Review of Systems     Objective:   Physical Exam  Constitutional: She appears well-developed and well-nourished. No distress.  Ears: T.M's intact without inflammation Throat: no tonsillar enlargement or exudate Neck: no cervical adenopathy Lungs: clear     Assessment:    1. Pharyngitis due to Streptococcus species - POCT rapid strep A - amoxicillin (AMOXIL) 875 MG tablet; Take 1 tablet (875 mg total) by mouth 2 (two) times daily.  Dispense: 20 tablet; Refill: 0    Plan:    Discussed use of saline gargles and nsaid's for throat discomfort.

## 2016-03-31 NOTE — Patient Instructions (Signed)
Discussed use of saline gargles and ibuprofen for throat discomfort.

## 2016-04-03 DIAGNOSIS — M542 Cervicalgia: Secondary | ICD-10-CM | POA: Diagnosis not present

## 2016-04-03 DIAGNOSIS — M47812 Spondylosis without myelopathy or radiculopathy, cervical region: Secondary | ICD-10-CM | POA: Diagnosis not present

## 2016-04-03 DIAGNOSIS — M5032 Other cervical disc degeneration, mid-cervical region, unspecified level: Secondary | ICD-10-CM | POA: Diagnosis not present

## 2016-04-03 DIAGNOSIS — M9901 Segmental and somatic dysfunction of cervical region: Secondary | ICD-10-CM | POA: Diagnosis not present

## 2016-04-05 DIAGNOSIS — M542 Cervicalgia: Secondary | ICD-10-CM | POA: Diagnosis not present

## 2016-04-05 DIAGNOSIS — M5032 Other cervical disc degeneration, mid-cervical region, unspecified level: Secondary | ICD-10-CM | POA: Diagnosis not present

## 2016-04-05 DIAGNOSIS — M9901 Segmental and somatic dysfunction of cervical region: Secondary | ICD-10-CM | POA: Diagnosis not present

## 2016-04-05 DIAGNOSIS — M47812 Spondylosis without myelopathy or radiculopathy, cervical region: Secondary | ICD-10-CM | POA: Diagnosis not present

## 2016-04-12 DIAGNOSIS — M542 Cervicalgia: Secondary | ICD-10-CM | POA: Diagnosis not present

## 2016-04-12 DIAGNOSIS — M9901 Segmental and somatic dysfunction of cervical region: Secondary | ICD-10-CM | POA: Diagnosis not present

## 2016-04-12 DIAGNOSIS — M47812 Spondylosis without myelopathy or radiculopathy, cervical region: Secondary | ICD-10-CM | POA: Diagnosis not present

## 2016-04-12 DIAGNOSIS — M5032 Other cervical disc degeneration, mid-cervical region, unspecified level: Secondary | ICD-10-CM | POA: Diagnosis not present

## 2016-04-19 DIAGNOSIS — M9901 Segmental and somatic dysfunction of cervical region: Secondary | ICD-10-CM | POA: Diagnosis not present

## 2016-04-19 DIAGNOSIS — M5032 Other cervical disc degeneration, mid-cervical region, unspecified level: Secondary | ICD-10-CM | POA: Diagnosis not present

## 2016-04-19 DIAGNOSIS — M542 Cervicalgia: Secondary | ICD-10-CM | POA: Diagnosis not present

## 2016-04-19 DIAGNOSIS — M47812 Spondylosis without myelopathy or radiculopathy, cervical region: Secondary | ICD-10-CM | POA: Diagnosis not present

## 2016-04-26 DIAGNOSIS — M542 Cervicalgia: Secondary | ICD-10-CM | POA: Diagnosis not present

## 2016-04-26 DIAGNOSIS — M5032 Other cervical disc degeneration, mid-cervical region, unspecified level: Secondary | ICD-10-CM | POA: Diagnosis not present

## 2016-04-26 DIAGNOSIS — M47812 Spondylosis without myelopathy or radiculopathy, cervical region: Secondary | ICD-10-CM | POA: Diagnosis not present

## 2016-04-26 DIAGNOSIS — M9901 Segmental and somatic dysfunction of cervical region: Secondary | ICD-10-CM | POA: Diagnosis not present

## 2016-05-05 DIAGNOSIS — M542 Cervicalgia: Secondary | ICD-10-CM | POA: Diagnosis not present

## 2016-05-05 DIAGNOSIS — M9901 Segmental and somatic dysfunction of cervical region: Secondary | ICD-10-CM | POA: Diagnosis not present

## 2016-05-05 DIAGNOSIS — M47812 Spondylosis without myelopathy or radiculopathy, cervical region: Secondary | ICD-10-CM | POA: Diagnosis not present

## 2016-05-05 DIAGNOSIS — M5032 Other cervical disc degeneration, mid-cervical region, unspecified level: Secondary | ICD-10-CM | POA: Diagnosis not present

## 2016-05-23 DIAGNOSIS — M9901 Segmental and somatic dysfunction of cervical region: Secondary | ICD-10-CM | POA: Diagnosis not present

## 2016-05-23 DIAGNOSIS — M542 Cervicalgia: Secondary | ICD-10-CM | POA: Diagnosis not present

## 2016-05-23 DIAGNOSIS — M47812 Spondylosis without myelopathy or radiculopathy, cervical region: Secondary | ICD-10-CM | POA: Diagnosis not present

## 2016-05-23 DIAGNOSIS — M5032 Other cervical disc degeneration, mid-cervical region, unspecified level: Secondary | ICD-10-CM | POA: Diagnosis not present

## 2016-06-07 DIAGNOSIS — M47812 Spondylosis without myelopathy or radiculopathy, cervical region: Secondary | ICD-10-CM | POA: Diagnosis not present

## 2016-06-07 DIAGNOSIS — M5032 Other cervical disc degeneration, mid-cervical region, unspecified level: Secondary | ICD-10-CM | POA: Diagnosis not present

## 2016-06-07 DIAGNOSIS — M9901 Segmental and somatic dysfunction of cervical region: Secondary | ICD-10-CM | POA: Diagnosis not present

## 2016-06-07 DIAGNOSIS — M542 Cervicalgia: Secondary | ICD-10-CM | POA: Diagnosis not present

## 2016-06-21 DIAGNOSIS — M9901 Segmental and somatic dysfunction of cervical region: Secondary | ICD-10-CM | POA: Diagnosis not present

## 2016-06-21 DIAGNOSIS — M5032 Other cervical disc degeneration, mid-cervical region, unspecified level: Secondary | ICD-10-CM | POA: Diagnosis not present

## 2016-06-21 DIAGNOSIS — M542 Cervicalgia: Secondary | ICD-10-CM | POA: Diagnosis not present

## 2016-06-21 DIAGNOSIS — M47812 Spondylosis without myelopathy or radiculopathy, cervical region: Secondary | ICD-10-CM | POA: Diagnosis not present

## 2016-07-19 DIAGNOSIS — M47812 Spondylosis without myelopathy or radiculopathy, cervical region: Secondary | ICD-10-CM | POA: Diagnosis not present

## 2016-07-19 DIAGNOSIS — M542 Cervicalgia: Secondary | ICD-10-CM | POA: Diagnosis not present

## 2016-07-19 DIAGNOSIS — M5032 Other cervical disc degeneration, mid-cervical region, unspecified level: Secondary | ICD-10-CM | POA: Diagnosis not present

## 2016-07-19 DIAGNOSIS — M9901 Segmental and somatic dysfunction of cervical region: Secondary | ICD-10-CM | POA: Diagnosis not present

## 2016-07-20 DIAGNOSIS — Z8379 Family history of other diseases of the digestive system: Secondary | ICD-10-CM | POA: Diagnosis not present

## 2016-07-20 DIAGNOSIS — Z86010 Personal history of colonic polyps: Secondary | ICD-10-CM | POA: Diagnosis not present

## 2016-07-20 DIAGNOSIS — K5904 Chronic idiopathic constipation: Secondary | ICD-10-CM | POA: Diagnosis not present

## 2016-07-20 DIAGNOSIS — Z1211 Encounter for screening for malignant neoplasm of colon: Secondary | ICD-10-CM | POA: Diagnosis not present

## 2016-08-09 DIAGNOSIS — Z86010 Personal history of colonic polyps: Secondary | ICD-10-CM | POA: Diagnosis not present

## 2016-08-09 DIAGNOSIS — K635 Polyp of colon: Secondary | ICD-10-CM | POA: Diagnosis not present

## 2016-08-09 DIAGNOSIS — D125 Benign neoplasm of sigmoid colon: Secondary | ICD-10-CM | POA: Diagnosis not present

## 2016-08-09 DIAGNOSIS — Z1211 Encounter for screening for malignant neoplasm of colon: Secondary | ICD-10-CM | POA: Diagnosis not present

## 2016-08-09 DIAGNOSIS — D123 Benign neoplasm of transverse colon: Secondary | ICD-10-CM | POA: Diagnosis not present

## 2016-08-09 LAB — HM COLONOSCOPY

## 2016-08-21 ENCOUNTER — Encounter: Payer: Self-pay | Admitting: Family Medicine

## 2016-08-23 DIAGNOSIS — M47812 Spondylosis without myelopathy or radiculopathy, cervical region: Secondary | ICD-10-CM | POA: Diagnosis not present

## 2016-08-23 DIAGNOSIS — M542 Cervicalgia: Secondary | ICD-10-CM | POA: Diagnosis not present

## 2016-08-23 DIAGNOSIS — M9901 Segmental and somatic dysfunction of cervical region: Secondary | ICD-10-CM | POA: Diagnosis not present

## 2016-08-23 DIAGNOSIS — M5032 Other cervical disc degeneration, mid-cervical region, unspecified level: Secondary | ICD-10-CM | POA: Diagnosis not present

## 2016-09-25 DIAGNOSIS — L80 Vitiligo: Secondary | ICD-10-CM | POA: Diagnosis not present

## 2016-09-25 DIAGNOSIS — L308 Other specified dermatitis: Secondary | ICD-10-CM | POA: Diagnosis not present

## 2016-10-25 DIAGNOSIS — Z1231 Encounter for screening mammogram for malignant neoplasm of breast: Secondary | ICD-10-CM | POA: Diagnosis not present

## 2016-11-14 ENCOUNTER — Other Ambulatory Visit: Payer: Self-pay

## 2016-11-14 ENCOUNTER — Encounter: Payer: Self-pay | Admitting: Obstetrics

## 2016-11-29 DIAGNOSIS — L700 Acne vulgaris: Secondary | ICD-10-CM | POA: Diagnosis not present

## 2016-11-29 DIAGNOSIS — L80 Vitiligo: Secondary | ICD-10-CM | POA: Diagnosis not present

## 2016-11-29 DIAGNOSIS — D485 Neoplasm of uncertain behavior of skin: Secondary | ICD-10-CM | POA: Diagnosis not present

## 2016-11-29 DIAGNOSIS — L578 Other skin changes due to chronic exposure to nonionizing radiation: Secondary | ICD-10-CM | POA: Diagnosis not present

## 2016-12-01 DIAGNOSIS — D485 Neoplasm of uncertain behavior of skin: Secondary | ICD-10-CM | POA: Diagnosis not present

## 2016-12-01 DIAGNOSIS — D225 Melanocytic nevi of trunk: Secondary | ICD-10-CM | POA: Diagnosis not present

## 2017-01-15 ENCOUNTER — Ambulatory Visit (INDEPENDENT_AMBULATORY_CARE_PROVIDER_SITE_OTHER): Payer: BLUE CROSS/BLUE SHIELD | Admitting: Physician Assistant

## 2017-01-15 DIAGNOSIS — Z23 Encounter for immunization: Secondary | ICD-10-CM | POA: Diagnosis not present

## 2017-04-04 DIAGNOSIS — Z13220 Encounter for screening for lipoid disorders: Secondary | ICD-10-CM | POA: Diagnosis not present

## 2017-04-04 DIAGNOSIS — Z0000 Encounter for general adult medical examination without abnormal findings: Secondary | ICD-10-CM | POA: Diagnosis not present

## 2017-04-04 DIAGNOSIS — Z6830 Body mass index (BMI) 30.0-30.9, adult: Secondary | ICD-10-CM | POA: Diagnosis not present

## 2017-04-04 DIAGNOSIS — Z1329 Encounter for screening for other suspected endocrine disorder: Secondary | ICD-10-CM | POA: Diagnosis not present

## 2017-04-04 DIAGNOSIS — Z131 Encounter for screening for diabetes mellitus: Secondary | ICD-10-CM | POA: Diagnosis not present

## 2017-04-04 DIAGNOSIS — Z01419 Encounter for gynecological examination (general) (routine) without abnormal findings: Secondary | ICD-10-CM | POA: Diagnosis not present

## 2017-04-19 DIAGNOSIS — Z713 Dietary counseling and surveillance: Secondary | ICD-10-CM | POA: Diagnosis not present

## 2017-04-19 DIAGNOSIS — Z6830 Body mass index (BMI) 30.0-30.9, adult: Secondary | ICD-10-CM | POA: Diagnosis not present

## 2017-04-19 DIAGNOSIS — R7989 Other specified abnormal findings of blood chemistry: Secondary | ICD-10-CM | POA: Diagnosis not present

## 2017-04-19 DIAGNOSIS — E669 Obesity, unspecified: Secondary | ICD-10-CM | POA: Diagnosis not present

## 2017-05-22 DIAGNOSIS — Z713 Dietary counseling and surveillance: Secondary | ICD-10-CM | POA: Diagnosis not present

## 2017-05-22 DIAGNOSIS — Z6830 Body mass index (BMI) 30.0-30.9, adult: Secondary | ICD-10-CM | POA: Diagnosis not present

## 2017-06-12 DIAGNOSIS — K5730 Diverticulosis of large intestine without perforation or abscess without bleeding: Secondary | ICD-10-CM | POA: Diagnosis not present

## 2017-06-12 DIAGNOSIS — Z86010 Personal history of colonic polyps: Secondary | ICD-10-CM | POA: Diagnosis not present

## 2017-06-12 DIAGNOSIS — K5904 Chronic idiopathic constipation: Secondary | ICD-10-CM | POA: Diagnosis not present

## 2017-06-12 DIAGNOSIS — R194 Change in bowel habit: Secondary | ICD-10-CM | POA: Diagnosis not present

## 2017-09-05 DIAGNOSIS — Z713 Dietary counseling and surveillance: Secondary | ICD-10-CM | POA: Diagnosis not present

## 2017-09-13 IMAGING — CT CT ABD-PELV W/ CM
2 of 5 series · 16 of 46 positions shown, 18 images · IV contrast (iopamidol)
Comparison: None.

CLINICAL DATA: Left lower quadrant pain. Abnormal colonoscopy.
Extrinsic compression of the cecum. Appendectomy

EXAM:
CT ABDOMEN AND PELVIS WITH CONTRAST
TECHNIQUE: Multidetector CT imaging of the abdomen and pelvis was performed
using the standard protocol following bolus administration of
intravenous contrast.
CONTRAST:  100mL UMTJAT-8HH IOPAMIDOL (UMTJAT-8HH) INJECTION 61%

[Series 2: abd/pelvis w/cm · axial · 0.69mm/px · z∈[+646,+1046]mm · 13 of 90 slices shown, 15 images]
[im 5/90  soft-tissue]
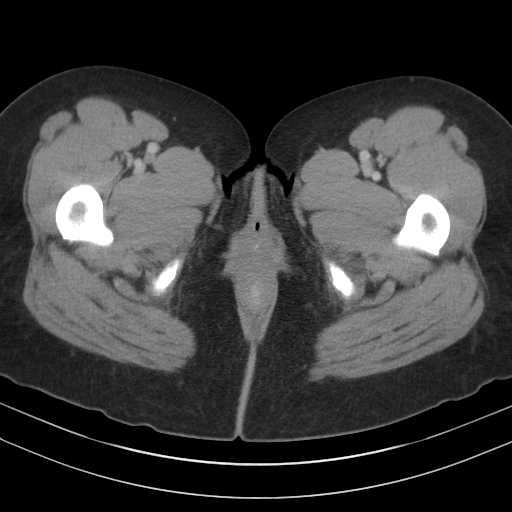
[im 5/90  bone]
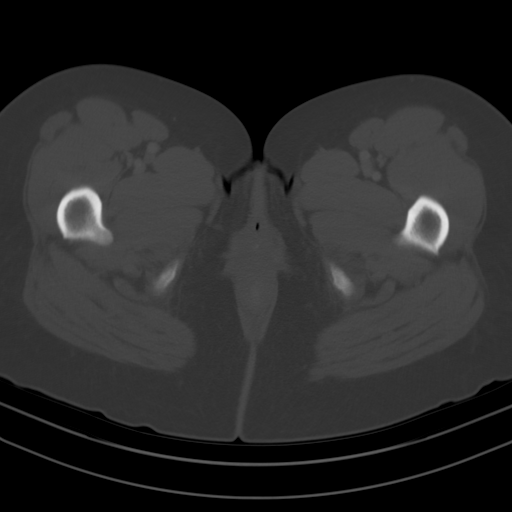
[im 14/90  soft-tissue]
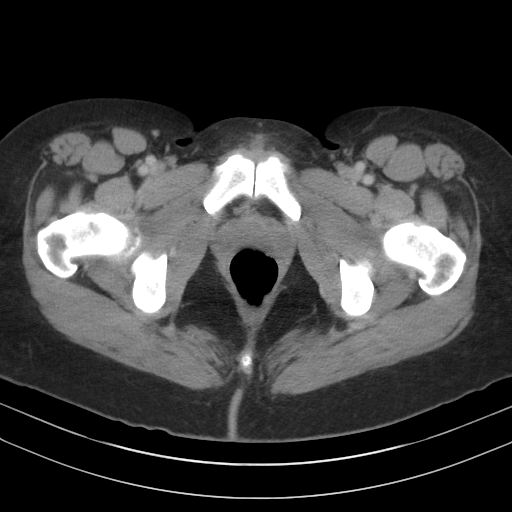
[im 18/90  soft-tissue]
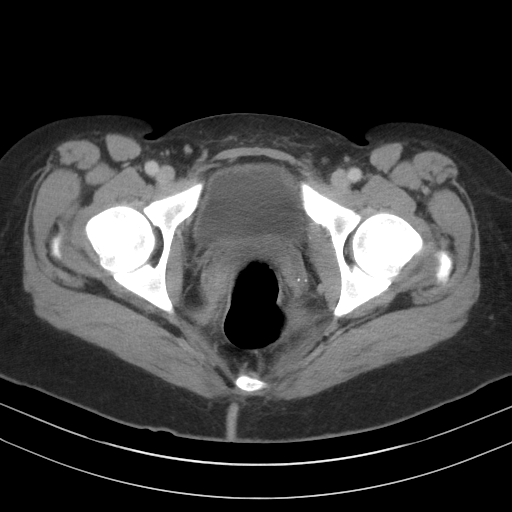
[im 27/90  soft-tissue]
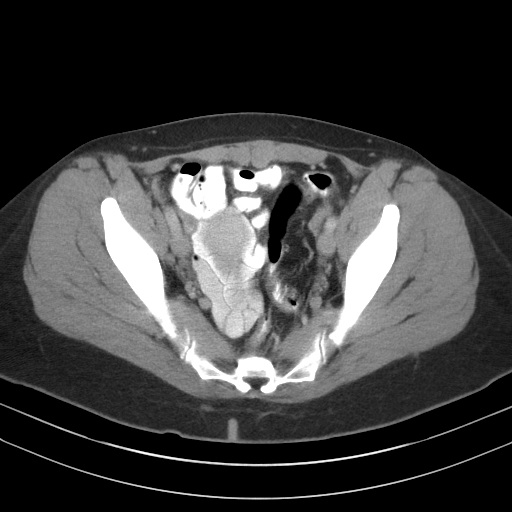
[im 32/90  soft-tissue]
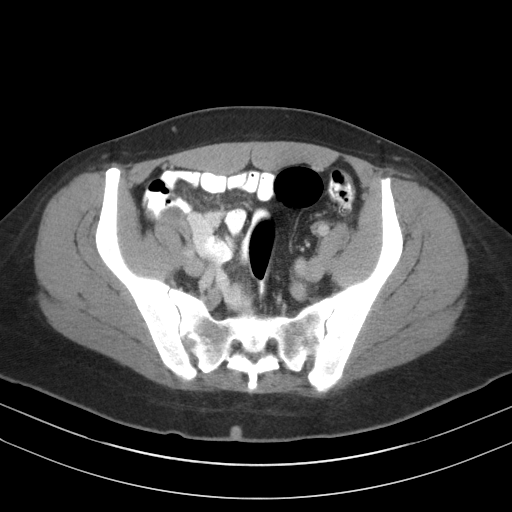
[im 41/90  soft-tissue]
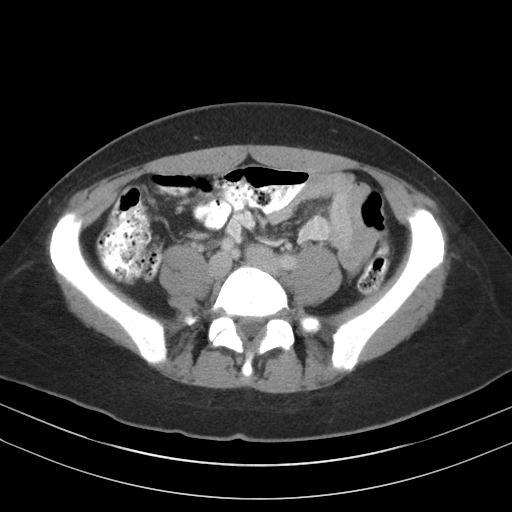
[im 45/90  soft-tissue]
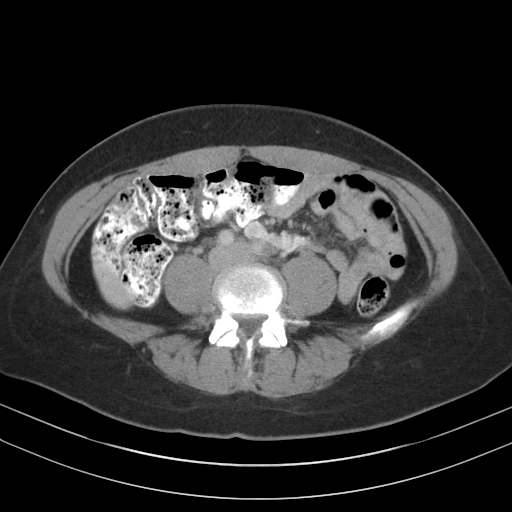
[im 49/90  soft-tissue]
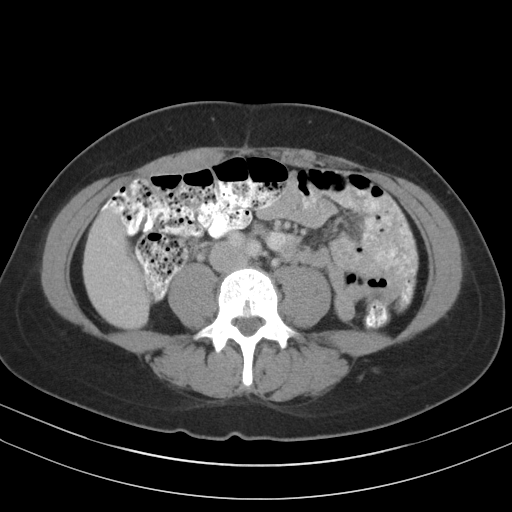
[im 58/90  soft-tissue]
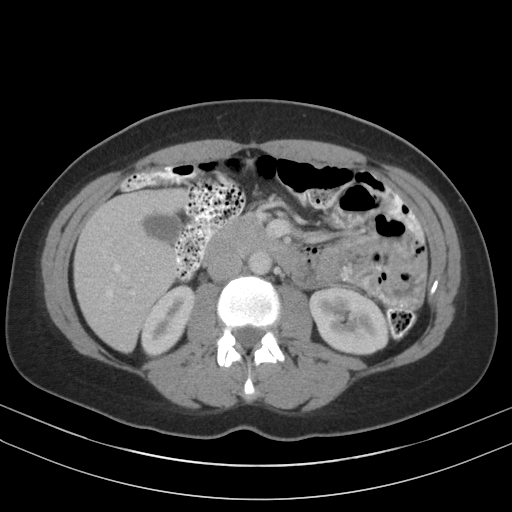
[im 58/90  bone]
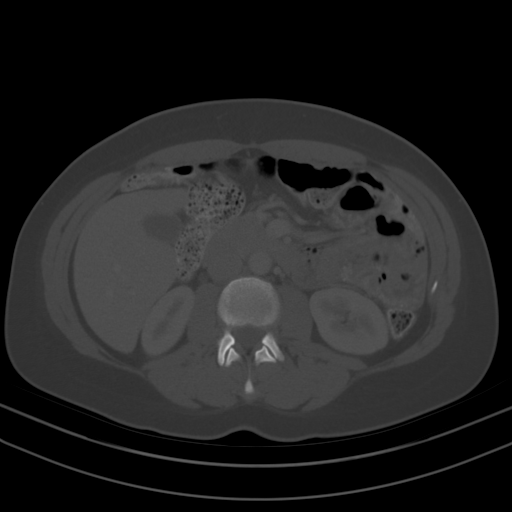
[im 63/90  soft-tissue]
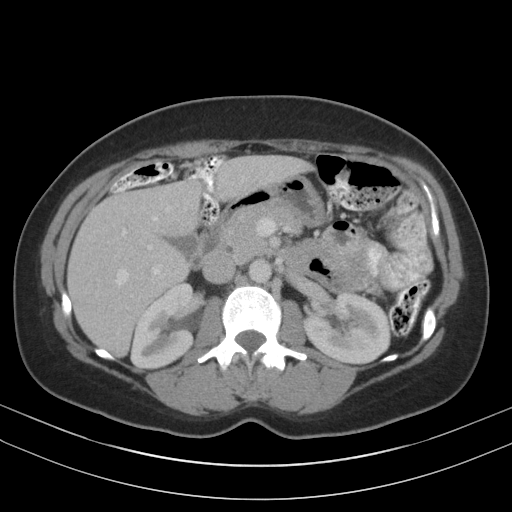
[im 72/90  soft-tissue]
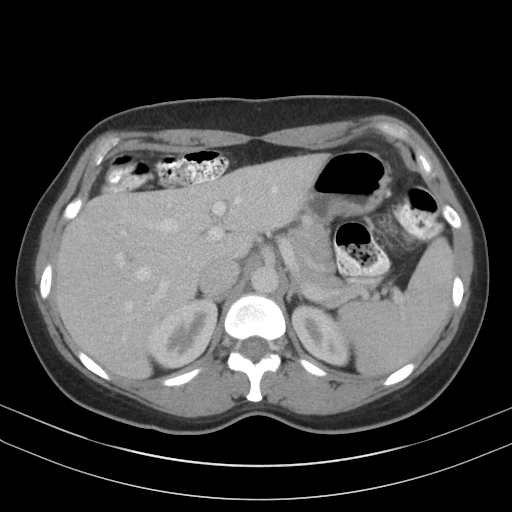
[im 76/90  soft-tissue]
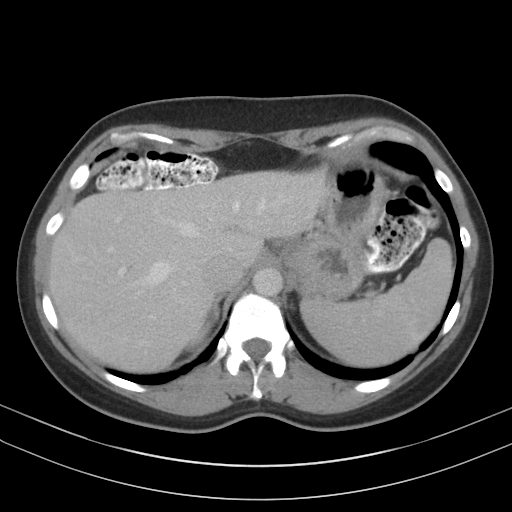
[im 85/90  soft-tissue]
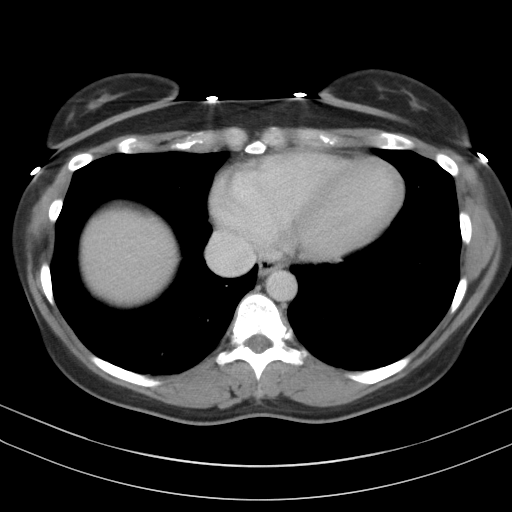

[Series 3: cor · coronal · 0.65mm/px · 3 of 78 slices shown]
[im 26/78  soft-tissue]
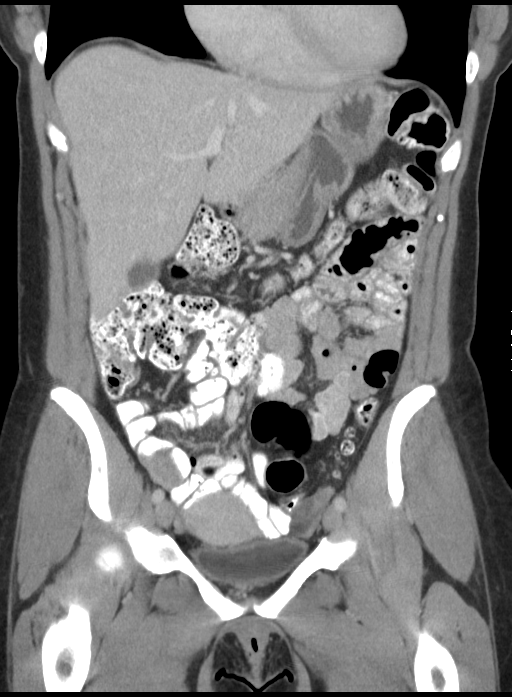
[im 35/78  soft-tissue]
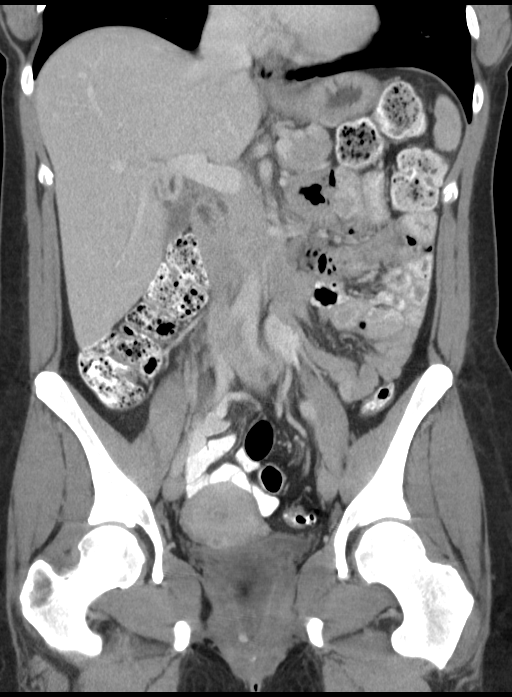
[im 43/78  soft-tissue]
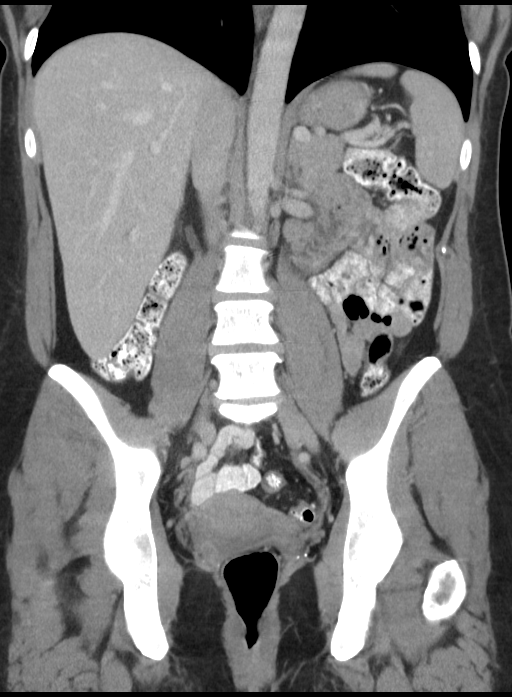

[16 of 46 positions shown; findings below may reference images not displayed]

FINDINGS: Lower chest:  Lung bases clear without infiltrate or effusion

Hepatobiliary: Normal liver.  Gallbladder and bile ducts normal.

Pancreas: Normal pancreas

Spleen: Normal

Adrenals/Urinary Tract: Symmetric excretion of contrast by both
kidneys. No renal mass or obstruction or stone. Ureters are normal
in caliber. Urinary bladder is partially filled and appears normal.

Stomach/Bowel: Negative for bowel obstruction. Moderate amount of
stool in the colon. Appendix not visualized due to appendectomy.
Cecum is in the midline extending horizontally across the abdomen.
Terminal ileum normal. No bowel edema or mass lesion identified.
Negative for diverticulitis. Mild sigmoid diverticulosis.

Vascular/Lymphatic: Aorta and IVC normal. Negative for
lymphadenopathy.

Reproductive: Anteverted uterus which appears normal. No pelvic mass
lesion.

Other: No free fluid.  Negative for hernia.

Musculoskeletal: Mild disc degeneration at L5-S1. No fracture or
focal bony abnormality.
IMPRESSION: Moderate stool throughout the colon. Prior appendectomy. Cecum
projects medially across the pelvis. No evidence of mass or edema in
the cecum or terminal ileum.

No acute abnormality.

## 2017-10-16 IMAGING — CR DG CERVICAL SPINE COMPLETE 4+V
1 series · 5 of 5 positions shown · non-contrast
Comparison: None.

CLINICAL DATA: Left-sided neck pain, no known injury, initial
encounter

EXAM:
CERVICAL SPINE - COMPLETE 4+ VIEW

[Series 1: dg cervical spine complete · 0.14mm/px · 5 of 5 slices shown]
[im 1/5]
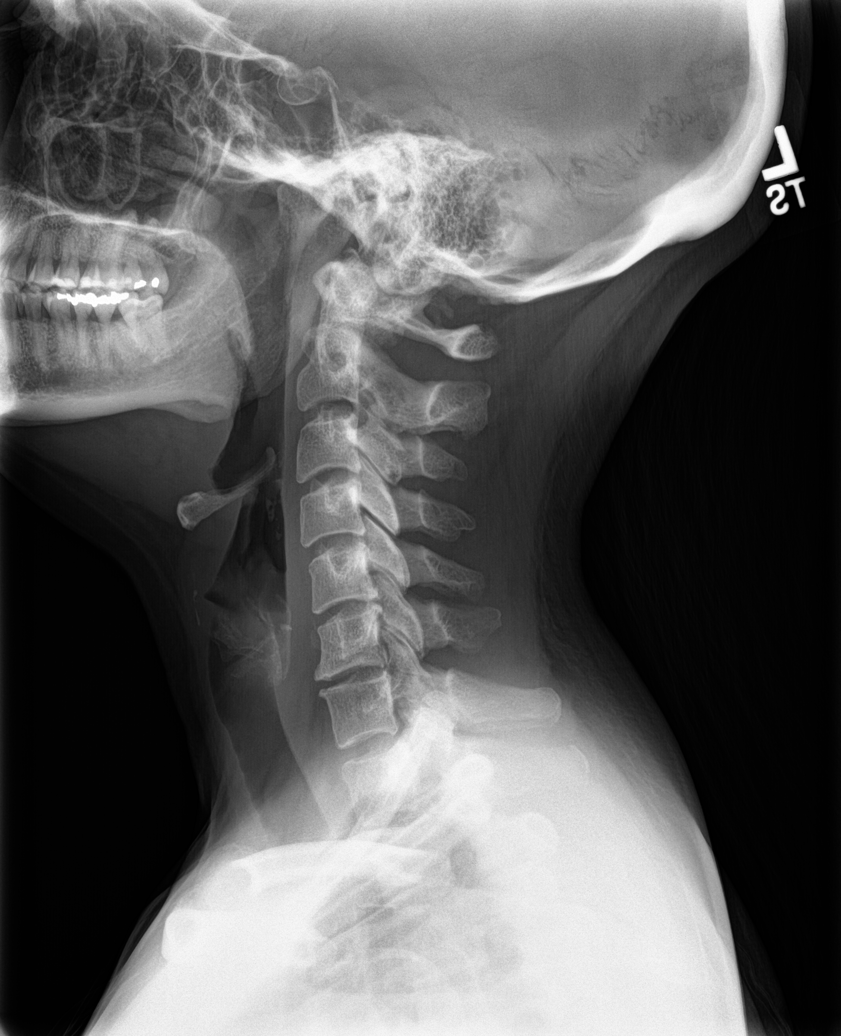
[im 2/5]
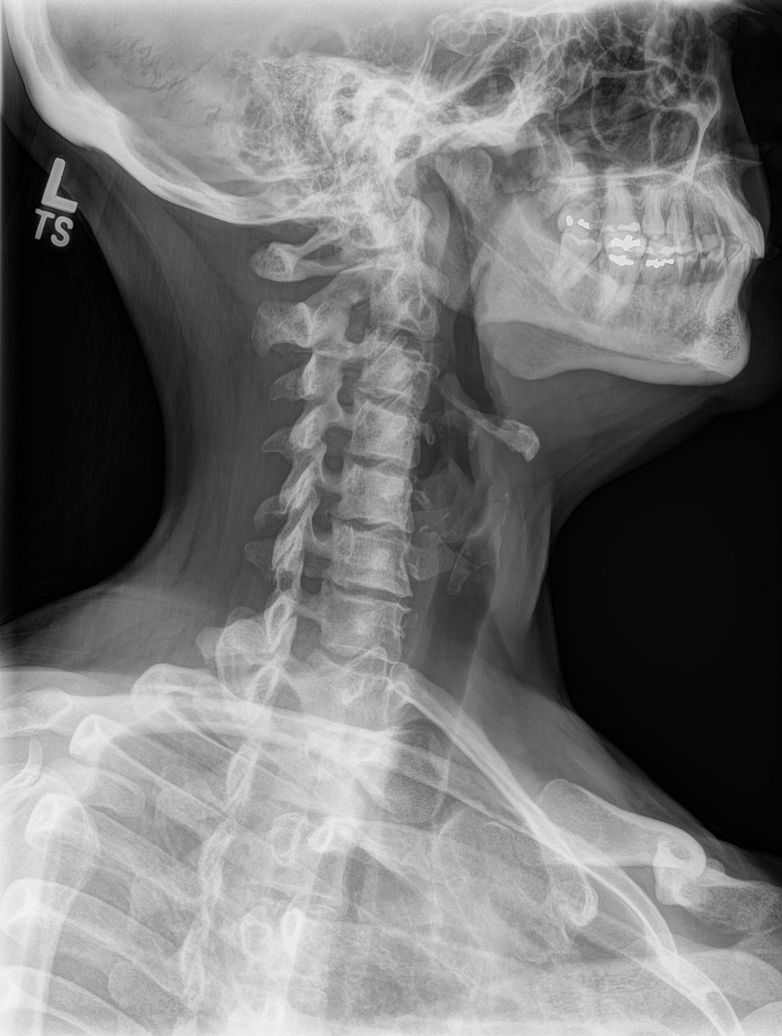
[im 3/5]
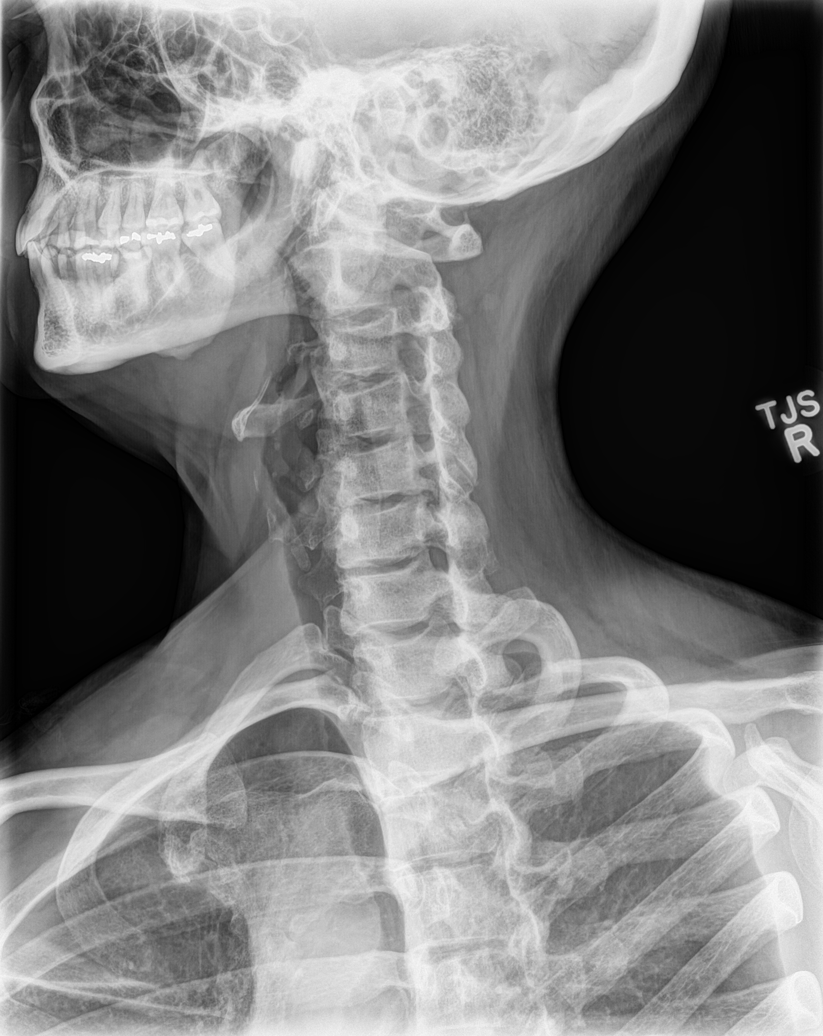
[im 4/5]
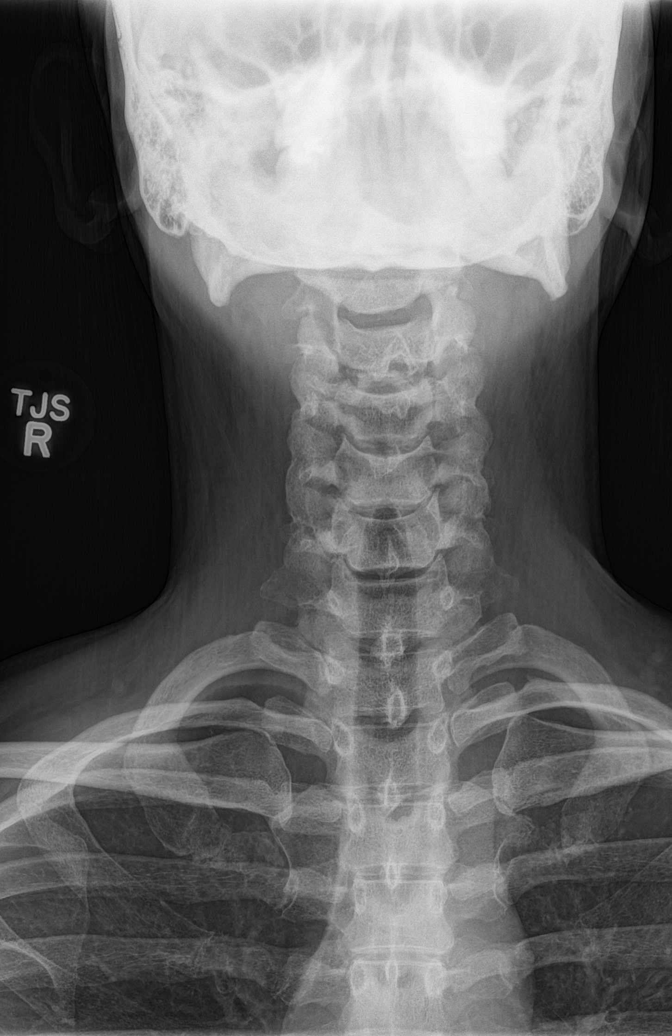
[im 5/5]
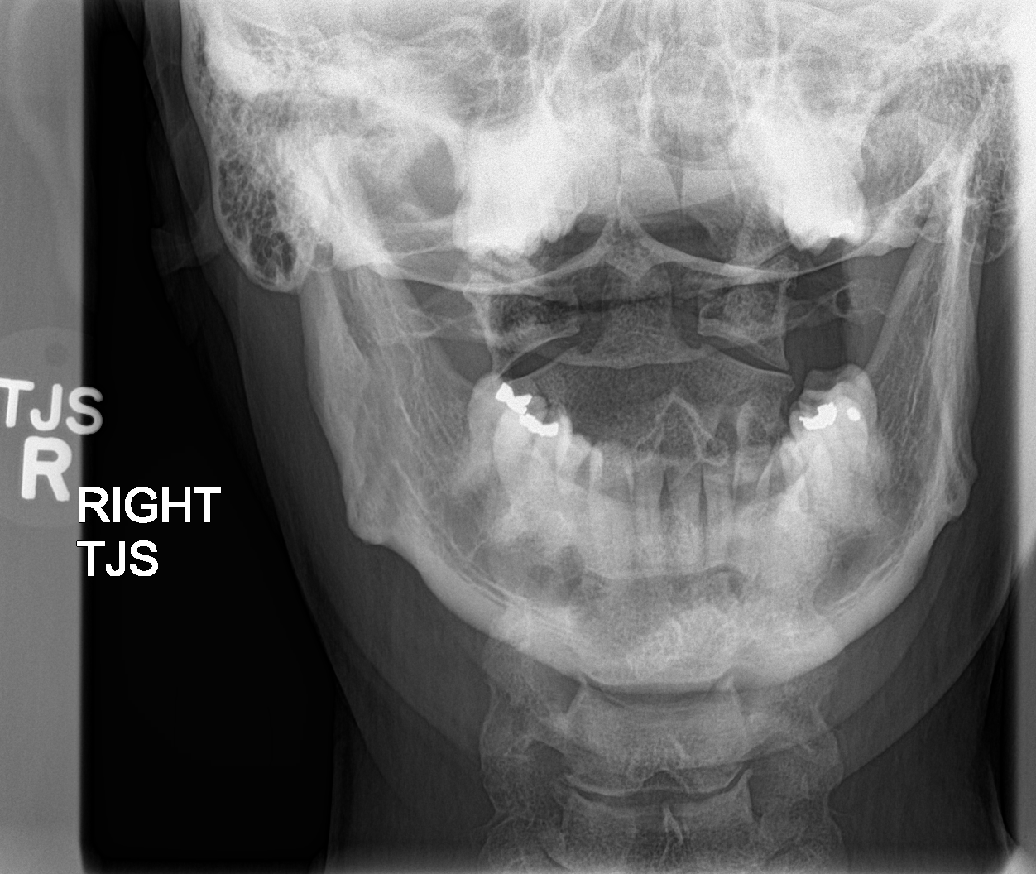

[5 of 5 positions shown; findings below may reference images not displayed]

FINDINGS: Mild disc space narrowing and osteophytic changes noted at C6-7. No
anterolisthesis is noted. Alignment is within normal limits. Mild
posterior osteophytes are seen with left neural foraminal narrowing
at C3-4, C4-5, C5-6 and C6-7. These changes are lesser on the right
with neural foraminal narrowing predominately at C5-6.
IMPRESSION: Degenerative changes without acute abnormality.

## 2017-11-05 DIAGNOSIS — L309 Dermatitis, unspecified: Secondary | ICD-10-CM | POA: Diagnosis not present

## 2017-11-05 DIAGNOSIS — R5383 Other fatigue: Secondary | ICD-10-CM | POA: Diagnosis not present

## 2017-11-05 DIAGNOSIS — N951 Menopausal and female climacteric states: Secondary | ICD-10-CM | POA: Diagnosis not present

## 2017-11-05 DIAGNOSIS — L80 Vitiligo: Secondary | ICD-10-CM | POA: Diagnosis not present

## 2017-11-28 DIAGNOSIS — Z1231 Encounter for screening mammogram for malignant neoplasm of breast: Secondary | ICD-10-CM | POA: Diagnosis not present

## 2017-11-28 LAB — HM MAMMOGRAPHY

## 2017-11-30 DIAGNOSIS — N951 Menopausal and female climacteric states: Secondary | ICD-10-CM | POA: Diagnosis not present

## 2017-12-03 DIAGNOSIS — L309 Dermatitis, unspecified: Secondary | ICD-10-CM | POA: Diagnosis not present

## 2017-12-03 DIAGNOSIS — R5383 Other fatigue: Secondary | ICD-10-CM | POA: Diagnosis not present

## 2017-12-03 DIAGNOSIS — N951 Menopausal and female climacteric states: Secondary | ICD-10-CM | POA: Diagnosis not present

## 2017-12-03 DIAGNOSIS — K581 Irritable bowel syndrome with constipation: Secondary | ICD-10-CM | POA: Diagnosis not present

## 2018-01-23 ENCOUNTER — Ambulatory Visit (INDEPENDENT_AMBULATORY_CARE_PROVIDER_SITE_OTHER): Payer: BLUE CROSS/BLUE SHIELD | Admitting: Family Medicine

## 2018-01-23 DIAGNOSIS — Z23 Encounter for immunization: Secondary | ICD-10-CM | POA: Diagnosis not present

## 2018-02-15 DIAGNOSIS — N943 Premenstrual tension syndrome: Secondary | ICD-10-CM | POA: Diagnosis not present

## 2018-02-15 DIAGNOSIS — E559 Vitamin D deficiency, unspecified: Secondary | ICD-10-CM | POA: Diagnosis not present

## 2018-02-15 DIAGNOSIS — R5383 Other fatigue: Secondary | ICD-10-CM | POA: Diagnosis not present

## 2018-03-11 DIAGNOSIS — R5383 Other fatigue: Secondary | ICD-10-CM | POA: Diagnosis not present

## 2018-03-11 DIAGNOSIS — L309 Dermatitis, unspecified: Secondary | ICD-10-CM | POA: Diagnosis not present

## 2018-03-11 DIAGNOSIS — N951 Menopausal and female climacteric states: Secondary | ICD-10-CM | POA: Diagnosis not present

## 2018-03-11 DIAGNOSIS — K581 Irritable bowel syndrome with constipation: Secondary | ICD-10-CM | POA: Diagnosis not present

## 2018-05-09 DIAGNOSIS — Z713 Dietary counseling and surveillance: Secondary | ICD-10-CM | POA: Diagnosis not present

## 2018-05-09 DIAGNOSIS — E6609 Other obesity due to excess calories: Secondary | ICD-10-CM | POA: Diagnosis not present

## 2018-05-09 DIAGNOSIS — Z6830 Body mass index (BMI) 30.0-30.9, adult: Secondary | ICD-10-CM | POA: Diagnosis not present

## 2018-06-10 DIAGNOSIS — Z713 Dietary counseling and surveillance: Secondary | ICD-10-CM | POA: Diagnosis not present

## 2018-06-10 DIAGNOSIS — E6609 Other obesity due to excess calories: Secondary | ICD-10-CM | POA: Diagnosis not present

## 2018-06-10 DIAGNOSIS — Z6830 Body mass index (BMI) 30.0-30.9, adult: Secondary | ICD-10-CM | POA: Diagnosis not present

## 2018-07-11 DIAGNOSIS — Z01419 Encounter for gynecological examination (general) (routine) without abnormal findings: Secondary | ICD-10-CM | POA: Diagnosis not present

## 2018-07-11 DIAGNOSIS — Z6828 Body mass index (BMI) 28.0-28.9, adult: Secondary | ICD-10-CM | POA: Diagnosis not present

## 2018-12-02 DIAGNOSIS — Z1231 Encounter for screening mammogram for malignant neoplasm of breast: Secondary | ICD-10-CM | POA: Diagnosis not present

## 2018-12-02 LAB — HM MAMMOGRAPHY

## 2019-05-19 ENCOUNTER — Ambulatory Visit: Payer: Self-pay | Attending: Internal Medicine

## 2019-05-19 ENCOUNTER — Other Ambulatory Visit: Payer: Self-pay

## 2019-05-19 DIAGNOSIS — Z23 Encounter for immunization: Secondary | ICD-10-CM

## 2019-05-19 NOTE — Progress Notes (Signed)
   Covid-19 Vaccination Clinic  Name:  Kendra Jimenez    MRN: [id number] DOB: 1965/11/24  05/19/2019  Kendra Jimenez was observed post Covid-19 immunization for 15 minutes without incident. She was provided with Vaccine Information Sheet and instruction to access the V-Safe system.   Kendra Jimenez was instructed to call 911 with any severe reactions post vaccine: Marland Kitchen Difficulty breathing  . Swelling of face and throat  . A fast heartbeat  . A bad rash all over body  . Dizziness and weakness   Immunizations Administered    Name Date Dose VIS Date Route   Pfizer COVID-19 Vaccine 05/19/2019  8:40 AM 0.3 mL 01/24/2019 Intramuscular   Manufacturer: [employer], [employer]   Lot: [id number]   NDC: [address]-7

## 2019-06-17 ENCOUNTER — Ambulatory Visit: Payer: Self-pay | Attending: Internal Medicine

## 2019-06-17 DIAGNOSIS — Z23 Encounter for immunization: Secondary | ICD-10-CM

## 2019-06-17 NOTE — Progress Notes (Signed)
   Covid-19 Vaccination Clinic  Name:  Viney Acocella    MRN: [id number] DOB: 1965/08/11  06/17/2019  Ms. Easler was observed post Covid-19 immunization for 15 minutes without incident. She was provided with Vaccine Information Sheet and instruction to access the V-Safe system.   Ms. Cummings was instructed to call 911 with any severe reactions post vaccine: Marland Kitchen Difficulty breathing  . Swelling of face and throat  . A fast heartbeat  . A bad rash all over body  . Dizziness and weakness   Immunizations Administered    Name Date Dose VIS Date Route   Pfizer COVID-19 Vaccine 06/17/2019  8:13 AM 0.3 mL 04/09/2018 Intramuscular   Manufacturer: [employer], [employer]   Lot: [id number]   NDC: [address]-1

## 2019-08-15 DIAGNOSIS — Z6826 Body mass index (BMI) 26.0-26.9, adult: Secondary | ICD-10-CM | POA: Diagnosis not present

## 2019-08-15 DIAGNOSIS — Z130 Encounter for screening for diseases of the blood and blood-forming organs and certain disorders involving the immune mechanism: Secondary | ICD-10-CM | POA: Diagnosis not present

## 2019-08-15 DIAGNOSIS — Z131 Encounter for screening for diabetes mellitus: Secondary | ICD-10-CM | POA: Diagnosis not present

## 2019-08-15 DIAGNOSIS — F329 Major depressive disorder, single episode, unspecified: Secondary | ICD-10-CM | POA: Diagnosis not present

## 2019-08-15 DIAGNOSIS — Z0000 Encounter for general adult medical examination without abnormal findings: Secondary | ICD-10-CM | POA: Diagnosis not present

## 2019-08-15 DIAGNOSIS — Z1151 Encounter for screening for human papillomavirus (HPV): Secondary | ICD-10-CM | POA: Diagnosis not present

## 2019-08-15 DIAGNOSIS — Z1329 Encounter for screening for other suspected endocrine disorder: Secondary | ICD-10-CM | POA: Diagnosis not present

## 2019-08-15 DIAGNOSIS — Z01419 Encounter for gynecological examination (general) (routine) without abnormal findings: Secondary | ICD-10-CM | POA: Diagnosis not present

## 2019-08-15 DIAGNOSIS — Z13220 Encounter for screening for lipoid disorders: Secondary | ICD-10-CM | POA: Diagnosis not present

## 2019-08-15 DIAGNOSIS — N951 Menopausal and female climacteric states: Secondary | ICD-10-CM | POA: Diagnosis not present

## 2019-08-28 DIAGNOSIS — M542 Cervicalgia: Secondary | ICD-10-CM | POA: Diagnosis not present

## 2019-08-28 DIAGNOSIS — M9901 Segmental and somatic dysfunction of cervical region: Secondary | ICD-10-CM | POA: Diagnosis not present

## 2019-08-28 DIAGNOSIS — M5412 Radiculopathy, cervical region: Secondary | ICD-10-CM | POA: Diagnosis not present

## 2019-08-28 DIAGNOSIS — M50323 Other cervical disc degeneration at C6-C7 level: Secondary | ICD-10-CM | POA: Diagnosis not present

## 2019-08-29 DIAGNOSIS — M542 Cervicalgia: Secondary | ICD-10-CM | POA: Diagnosis not present

## 2019-08-29 DIAGNOSIS — M50323 Other cervical disc degeneration at C6-C7 level: Secondary | ICD-10-CM | POA: Diagnosis not present

## 2019-08-29 DIAGNOSIS — M5412 Radiculopathy, cervical region: Secondary | ICD-10-CM | POA: Diagnosis not present

## 2019-08-29 DIAGNOSIS — M9901 Segmental and somatic dysfunction of cervical region: Secondary | ICD-10-CM | POA: Diagnosis not present

## 2019-09-01 DIAGNOSIS — M5412 Radiculopathy, cervical region: Secondary | ICD-10-CM | POA: Diagnosis not present

## 2019-09-01 DIAGNOSIS — M542 Cervicalgia: Secondary | ICD-10-CM | POA: Diagnosis not present

## 2019-09-01 DIAGNOSIS — M9901 Segmental and somatic dysfunction of cervical region: Secondary | ICD-10-CM | POA: Diagnosis not present

## 2019-09-01 DIAGNOSIS — M50323 Other cervical disc degeneration at C6-C7 level: Secondary | ICD-10-CM | POA: Diagnosis not present

## 2019-09-03 DIAGNOSIS — M9901 Segmental and somatic dysfunction of cervical region: Secondary | ICD-10-CM | POA: Diagnosis not present

## 2019-09-03 DIAGNOSIS — M5412 Radiculopathy, cervical region: Secondary | ICD-10-CM | POA: Diagnosis not present

## 2019-09-03 DIAGNOSIS — M50323 Other cervical disc degeneration at C6-C7 level: Secondary | ICD-10-CM | POA: Diagnosis not present

## 2019-09-03 DIAGNOSIS — M542 Cervicalgia: Secondary | ICD-10-CM | POA: Diagnosis not present

## 2019-09-05 DIAGNOSIS — M50323 Other cervical disc degeneration at C6-C7 level: Secondary | ICD-10-CM | POA: Diagnosis not present

## 2019-09-05 DIAGNOSIS — M5412 Radiculopathy, cervical region: Secondary | ICD-10-CM | POA: Diagnosis not present

## 2019-09-05 DIAGNOSIS — M9901 Segmental and somatic dysfunction of cervical region: Secondary | ICD-10-CM | POA: Diagnosis not present

## 2019-09-05 DIAGNOSIS — M542 Cervicalgia: Secondary | ICD-10-CM | POA: Diagnosis not present

## 2019-09-15 DIAGNOSIS — M542 Cervicalgia: Secondary | ICD-10-CM | POA: Diagnosis not present

## 2019-09-15 DIAGNOSIS — M5412 Radiculopathy, cervical region: Secondary | ICD-10-CM | POA: Diagnosis not present

## 2019-09-15 DIAGNOSIS — M9901 Segmental and somatic dysfunction of cervical region: Secondary | ICD-10-CM | POA: Diagnosis not present

## 2019-09-15 DIAGNOSIS — M50323 Other cervical disc degeneration at C6-C7 level: Secondary | ICD-10-CM | POA: Diagnosis not present

## 2019-09-17 DIAGNOSIS — M542 Cervicalgia: Secondary | ICD-10-CM | POA: Diagnosis not present

## 2019-09-17 DIAGNOSIS — M5412 Radiculopathy, cervical region: Secondary | ICD-10-CM | POA: Diagnosis not present

## 2019-09-17 DIAGNOSIS — M50323 Other cervical disc degeneration at C6-C7 level: Secondary | ICD-10-CM | POA: Diagnosis not present

## 2019-09-17 DIAGNOSIS — M9901 Segmental and somatic dysfunction of cervical region: Secondary | ICD-10-CM | POA: Diagnosis not present

## 2019-09-19 DIAGNOSIS — M5412 Radiculopathy, cervical region: Secondary | ICD-10-CM | POA: Diagnosis not present

## 2019-09-19 DIAGNOSIS — M9901 Segmental and somatic dysfunction of cervical region: Secondary | ICD-10-CM | POA: Diagnosis not present

## 2019-09-19 DIAGNOSIS — M542 Cervicalgia: Secondary | ICD-10-CM | POA: Diagnosis not present

## 2019-09-19 DIAGNOSIS — M50323 Other cervical disc degeneration at C6-C7 level: Secondary | ICD-10-CM | POA: Diagnosis not present

## 2019-09-22 DIAGNOSIS — M5412 Radiculopathy, cervical region: Secondary | ICD-10-CM | POA: Diagnosis not present

## 2019-09-22 DIAGNOSIS — M542 Cervicalgia: Secondary | ICD-10-CM | POA: Diagnosis not present

## 2019-09-22 DIAGNOSIS — M50323 Other cervical disc degeneration at C6-C7 level: Secondary | ICD-10-CM | POA: Diagnosis not present

## 2019-09-22 DIAGNOSIS — M9901 Segmental and somatic dysfunction of cervical region: Secondary | ICD-10-CM | POA: Diagnosis not present

## 2019-09-24 DIAGNOSIS — M50323 Other cervical disc degeneration at C6-C7 level: Secondary | ICD-10-CM | POA: Diagnosis not present

## 2019-09-24 DIAGNOSIS — M9901 Segmental and somatic dysfunction of cervical region: Secondary | ICD-10-CM | POA: Diagnosis not present

## 2019-09-24 DIAGNOSIS — M5412 Radiculopathy, cervical region: Secondary | ICD-10-CM | POA: Diagnosis not present

## 2019-09-24 DIAGNOSIS — M542 Cervicalgia: Secondary | ICD-10-CM | POA: Diagnosis not present

## 2019-09-26 DIAGNOSIS — M5412 Radiculopathy, cervical region: Secondary | ICD-10-CM | POA: Diagnosis not present

## 2019-09-26 DIAGNOSIS — M9901 Segmental and somatic dysfunction of cervical region: Secondary | ICD-10-CM | POA: Diagnosis not present

## 2019-09-26 DIAGNOSIS — M542 Cervicalgia: Secondary | ICD-10-CM | POA: Diagnosis not present

## 2019-09-26 DIAGNOSIS — M50323 Other cervical disc degeneration at C6-C7 level: Secondary | ICD-10-CM | POA: Diagnosis not present

## 2019-09-29 DIAGNOSIS — M9901 Segmental and somatic dysfunction of cervical region: Secondary | ICD-10-CM | POA: Diagnosis not present

## 2019-09-29 DIAGNOSIS — M50323 Other cervical disc degeneration at C6-C7 level: Secondary | ICD-10-CM | POA: Diagnosis not present

## 2019-09-29 DIAGNOSIS — M542 Cervicalgia: Secondary | ICD-10-CM | POA: Diagnosis not present

## 2019-09-29 DIAGNOSIS — M5412 Radiculopathy, cervical region: Secondary | ICD-10-CM | POA: Diagnosis not present

## 2019-10-01 DIAGNOSIS — M50323 Other cervical disc degeneration at C6-C7 level: Secondary | ICD-10-CM | POA: Diagnosis not present

## 2019-10-01 DIAGNOSIS — M542 Cervicalgia: Secondary | ICD-10-CM | POA: Diagnosis not present

## 2019-10-01 DIAGNOSIS — M5412 Radiculopathy, cervical region: Secondary | ICD-10-CM | POA: Diagnosis not present

## 2019-10-01 DIAGNOSIS — M9901 Segmental and somatic dysfunction of cervical region: Secondary | ICD-10-CM | POA: Diagnosis not present

## 2019-10-03 DIAGNOSIS — M50323 Other cervical disc degeneration at C6-C7 level: Secondary | ICD-10-CM | POA: Diagnosis not present

## 2019-10-03 DIAGNOSIS — M9901 Segmental and somatic dysfunction of cervical region: Secondary | ICD-10-CM | POA: Diagnosis not present

## 2019-10-03 DIAGNOSIS — M542 Cervicalgia: Secondary | ICD-10-CM | POA: Diagnosis not present

## 2019-10-03 DIAGNOSIS — M5412 Radiculopathy, cervical region: Secondary | ICD-10-CM | POA: Diagnosis not present

## 2019-10-06 DIAGNOSIS — M50323 Other cervical disc degeneration at C6-C7 level: Secondary | ICD-10-CM | POA: Diagnosis not present

## 2019-10-06 DIAGNOSIS — M9901 Segmental and somatic dysfunction of cervical region: Secondary | ICD-10-CM | POA: Diagnosis not present

## 2019-10-06 DIAGNOSIS — M5412 Radiculopathy, cervical region: Secondary | ICD-10-CM | POA: Diagnosis not present

## 2019-10-06 DIAGNOSIS — M542 Cervicalgia: Secondary | ICD-10-CM | POA: Diagnosis not present

## 2019-10-10 DIAGNOSIS — M5412 Radiculopathy, cervical region: Secondary | ICD-10-CM | POA: Diagnosis not present

## 2019-10-10 DIAGNOSIS — M50323 Other cervical disc degeneration at C6-C7 level: Secondary | ICD-10-CM | POA: Diagnosis not present

## 2019-10-10 DIAGNOSIS — M542 Cervicalgia: Secondary | ICD-10-CM | POA: Diagnosis not present

## 2019-10-10 DIAGNOSIS — M9901 Segmental and somatic dysfunction of cervical region: Secondary | ICD-10-CM | POA: Diagnosis not present

## 2019-10-13 DIAGNOSIS — M9901 Segmental and somatic dysfunction of cervical region: Secondary | ICD-10-CM | POA: Diagnosis not present

## 2019-10-13 DIAGNOSIS — M50323 Other cervical disc degeneration at C6-C7 level: Secondary | ICD-10-CM | POA: Diagnosis not present

## 2019-10-13 DIAGNOSIS — M542 Cervicalgia: Secondary | ICD-10-CM | POA: Diagnosis not present

## 2019-10-13 DIAGNOSIS — M5412 Radiculopathy, cervical region: Secondary | ICD-10-CM | POA: Diagnosis not present

## 2019-10-17 DIAGNOSIS — M542 Cervicalgia: Secondary | ICD-10-CM | POA: Diagnosis not present

## 2019-10-17 DIAGNOSIS — M50323 Other cervical disc degeneration at C6-C7 level: Secondary | ICD-10-CM | POA: Diagnosis not present

## 2019-10-17 DIAGNOSIS — M5412 Radiculopathy, cervical region: Secondary | ICD-10-CM | POA: Diagnosis not present

## 2019-10-17 DIAGNOSIS — M9901 Segmental and somatic dysfunction of cervical region: Secondary | ICD-10-CM | POA: Diagnosis not present

## 2019-10-21 DIAGNOSIS — M542 Cervicalgia: Secondary | ICD-10-CM | POA: Diagnosis not present

## 2019-10-21 DIAGNOSIS — M50323 Other cervical disc degeneration at C6-C7 level: Secondary | ICD-10-CM | POA: Diagnosis not present

## 2019-10-21 DIAGNOSIS — M9901 Segmental and somatic dysfunction of cervical region: Secondary | ICD-10-CM | POA: Diagnosis not present

## 2019-10-21 DIAGNOSIS — M5412 Radiculopathy, cervical region: Secondary | ICD-10-CM | POA: Diagnosis not present

## 2019-10-24 DIAGNOSIS — M542 Cervicalgia: Secondary | ICD-10-CM | POA: Diagnosis not present

## 2019-10-24 DIAGNOSIS — M50323 Other cervical disc degeneration at C6-C7 level: Secondary | ICD-10-CM | POA: Diagnosis not present

## 2019-10-24 DIAGNOSIS — M9901 Segmental and somatic dysfunction of cervical region: Secondary | ICD-10-CM | POA: Diagnosis not present

## 2019-10-24 DIAGNOSIS — M5412 Radiculopathy, cervical region: Secondary | ICD-10-CM | POA: Diagnosis not present

## 2019-10-27 DIAGNOSIS — M542 Cervicalgia: Secondary | ICD-10-CM | POA: Diagnosis not present

## 2019-10-27 DIAGNOSIS — M5412 Radiculopathy, cervical region: Secondary | ICD-10-CM | POA: Diagnosis not present

## 2019-10-27 DIAGNOSIS — M9901 Segmental and somatic dysfunction of cervical region: Secondary | ICD-10-CM | POA: Diagnosis not present

## 2019-10-27 DIAGNOSIS — M50323 Other cervical disc degeneration at C6-C7 level: Secondary | ICD-10-CM | POA: Diagnosis not present

## 2019-10-31 DIAGNOSIS — M9901 Segmental and somatic dysfunction of cervical region: Secondary | ICD-10-CM | POA: Diagnosis not present

## 2019-10-31 DIAGNOSIS — M50323 Other cervical disc degeneration at C6-C7 level: Secondary | ICD-10-CM | POA: Diagnosis not present

## 2019-10-31 DIAGNOSIS — M5412 Radiculopathy, cervical region: Secondary | ICD-10-CM | POA: Diagnosis not present

## 2019-10-31 DIAGNOSIS — M542 Cervicalgia: Secondary | ICD-10-CM | POA: Diagnosis not present

## 2019-11-03 DIAGNOSIS — M9901 Segmental and somatic dysfunction of cervical region: Secondary | ICD-10-CM | POA: Diagnosis not present

## 2019-11-03 DIAGNOSIS — M50323 Other cervical disc degeneration at C6-C7 level: Secondary | ICD-10-CM | POA: Diagnosis not present

## 2019-11-03 DIAGNOSIS — M542 Cervicalgia: Secondary | ICD-10-CM | POA: Diagnosis not present

## 2019-11-03 DIAGNOSIS — M5412 Radiculopathy, cervical region: Secondary | ICD-10-CM | POA: Diagnosis not present

## 2019-11-07 DIAGNOSIS — M5412 Radiculopathy, cervical region: Secondary | ICD-10-CM | POA: Diagnosis not present

## 2019-11-07 DIAGNOSIS — M542 Cervicalgia: Secondary | ICD-10-CM | POA: Diagnosis not present

## 2019-11-07 DIAGNOSIS — M50323 Other cervical disc degeneration at C6-C7 level: Secondary | ICD-10-CM | POA: Diagnosis not present

## 2019-11-07 DIAGNOSIS — M9901 Segmental and somatic dysfunction of cervical region: Secondary | ICD-10-CM | POA: Diagnosis not present

## 2019-11-10 DIAGNOSIS — M9901 Segmental and somatic dysfunction of cervical region: Secondary | ICD-10-CM | POA: Diagnosis not present

## 2019-11-10 DIAGNOSIS — M5412 Radiculopathy, cervical region: Secondary | ICD-10-CM | POA: Diagnosis not present

## 2019-11-10 DIAGNOSIS — M542 Cervicalgia: Secondary | ICD-10-CM | POA: Diagnosis not present

## 2019-11-10 DIAGNOSIS — M50323 Other cervical disc degeneration at C6-C7 level: Secondary | ICD-10-CM | POA: Diagnosis not present

## 2019-11-14 DIAGNOSIS — M9901 Segmental and somatic dysfunction of cervical region: Secondary | ICD-10-CM | POA: Diagnosis not present

## 2019-11-14 DIAGNOSIS — M9902 Segmental and somatic dysfunction of thoracic region: Secondary | ICD-10-CM | POA: Diagnosis not present

## 2019-11-14 DIAGNOSIS — M9907 Segmental and somatic dysfunction of upper extremity: Secondary | ICD-10-CM | POA: Diagnosis not present

## 2019-11-14 DIAGNOSIS — M9903 Segmental and somatic dysfunction of lumbar region: Secondary | ICD-10-CM | POA: Diagnosis not present

## 2019-11-17 DIAGNOSIS — M9907 Segmental and somatic dysfunction of upper extremity: Secondary | ICD-10-CM | POA: Diagnosis not present

## 2019-11-17 DIAGNOSIS — M9902 Segmental and somatic dysfunction of thoracic region: Secondary | ICD-10-CM | POA: Diagnosis not present

## 2019-11-17 DIAGNOSIS — M9901 Segmental and somatic dysfunction of cervical region: Secondary | ICD-10-CM | POA: Diagnosis not present

## 2019-11-17 DIAGNOSIS — M9903 Segmental and somatic dysfunction of lumbar region: Secondary | ICD-10-CM | POA: Diagnosis not present

## 2019-11-21 DIAGNOSIS — M9907 Segmental and somatic dysfunction of upper extremity: Secondary | ICD-10-CM | POA: Diagnosis not present

## 2019-11-21 DIAGNOSIS — M9903 Segmental and somatic dysfunction of lumbar region: Secondary | ICD-10-CM | POA: Diagnosis not present

## 2019-11-21 DIAGNOSIS — M9901 Segmental and somatic dysfunction of cervical region: Secondary | ICD-10-CM | POA: Diagnosis not present

## 2019-11-21 DIAGNOSIS — M9902 Segmental and somatic dysfunction of thoracic region: Secondary | ICD-10-CM | POA: Diagnosis not present

## 2019-11-24 DIAGNOSIS — M9907 Segmental and somatic dysfunction of upper extremity: Secondary | ICD-10-CM | POA: Diagnosis not present

## 2019-11-24 DIAGNOSIS — M9901 Segmental and somatic dysfunction of cervical region: Secondary | ICD-10-CM | POA: Diagnosis not present

## 2019-11-24 DIAGNOSIS — M9902 Segmental and somatic dysfunction of thoracic region: Secondary | ICD-10-CM | POA: Diagnosis not present

## 2019-11-24 DIAGNOSIS — M9903 Segmental and somatic dysfunction of lumbar region: Secondary | ICD-10-CM | POA: Diagnosis not present

## 2019-11-28 DIAGNOSIS — M9907 Segmental and somatic dysfunction of upper extremity: Secondary | ICD-10-CM | POA: Diagnosis not present

## 2019-11-28 DIAGNOSIS — M9902 Segmental and somatic dysfunction of thoracic region: Secondary | ICD-10-CM | POA: Diagnosis not present

## 2019-11-28 DIAGNOSIS — M9901 Segmental and somatic dysfunction of cervical region: Secondary | ICD-10-CM | POA: Diagnosis not present

## 2019-11-28 DIAGNOSIS — M9903 Segmental and somatic dysfunction of lumbar region: Secondary | ICD-10-CM | POA: Diagnosis not present

## 2020-04-19 DIAGNOSIS — Z1231 Encounter for screening mammogram for malignant neoplasm of breast: Secondary | ICD-10-CM | POA: Diagnosis not present

## 2020-08-25 DIAGNOSIS — M25562 Pain in left knee: Secondary | ICD-10-CM | POA: Diagnosis not present

## 2020-09-20 DIAGNOSIS — M25562 Pain in left knee: Secondary | ICD-10-CM | POA: Diagnosis not present

## 2020-09-20 DIAGNOSIS — M25561 Pain in right knee: Secondary | ICD-10-CM | POA: Diagnosis not present

## 2020-09-28 DIAGNOSIS — R87610 Atypical squamous cells of undetermined significance on cytologic smear of cervix (ASC-US): Secondary | ICD-10-CM | POA: Diagnosis not present

## 2020-09-28 DIAGNOSIS — Z01419 Encounter for gynecological examination (general) (routine) without abnormal findings: Secondary | ICD-10-CM | POA: Diagnosis not present

## 2020-09-28 DIAGNOSIS — R413 Other amnesia: Secondary | ICD-10-CM | POA: Diagnosis not present

## 2020-09-28 DIAGNOSIS — Z6827 Body mass index (BMI) 27.0-27.9, adult: Secondary | ICD-10-CM | POA: Diagnosis not present

## 2020-09-28 DIAGNOSIS — F32A Depression, unspecified: Secondary | ICD-10-CM | POA: Diagnosis not present

## 2020-09-29 DIAGNOSIS — M25562 Pain in left knee: Secondary | ICD-10-CM | POA: Diagnosis not present

## 2020-09-29 DIAGNOSIS — M25561 Pain in right knee: Secondary | ICD-10-CM | POA: Diagnosis not present

## 2020-10-06 DIAGNOSIS — M25561 Pain in right knee: Secondary | ICD-10-CM | POA: Diagnosis not present

## 2020-10-06 DIAGNOSIS — M25562 Pain in left knee: Secondary | ICD-10-CM | POA: Diagnosis not present

## 2020-10-11 DIAGNOSIS — M25562 Pain in left knee: Secondary | ICD-10-CM | POA: Diagnosis not present

## 2020-10-11 DIAGNOSIS — M25561 Pain in right knee: Secondary | ICD-10-CM | POA: Diagnosis not present

## 2021-04-20 DIAGNOSIS — Z1231 Encounter for screening mammogram for malignant neoplasm of breast: Secondary | ICD-10-CM | POA: Diagnosis not present

## 2021-04-20 LAB — HM MAMMOGRAPHY

## 2021-04-26 ENCOUNTER — Encounter: Payer: Self-pay | Admitting: Family Medicine

## 2021-09-06 DIAGNOSIS — E669 Obesity, unspecified: Secondary | ICD-10-CM | POA: Diagnosis not present

## 2021-09-06 DIAGNOSIS — K5730 Diverticulosis of large intestine without perforation or abscess without bleeding: Secondary | ICD-10-CM | POA: Diagnosis not present

## 2021-09-06 DIAGNOSIS — Z1211 Encounter for screening for malignant neoplasm of colon: Secondary | ICD-10-CM | POA: Diagnosis not present

## 2021-09-06 DIAGNOSIS — K219 Gastro-esophageal reflux disease without esophagitis: Secondary | ICD-10-CM | POA: Diagnosis not present

## 2021-09-06 DIAGNOSIS — R194 Change in bowel habit: Secondary | ICD-10-CM | POA: Diagnosis not present

## 2021-09-29 DIAGNOSIS — Z01419 Encounter for gynecological examination (general) (routine) without abnormal findings: Secondary | ICD-10-CM | POA: Diagnosis not present

## 2021-09-29 DIAGNOSIS — Z6829 Body mass index (BMI) 29.0-29.9, adult: Secondary | ICD-10-CM | POA: Diagnosis not present

## 2021-10-12 DIAGNOSIS — K5730 Diverticulosis of large intestine without perforation or abscess without bleeding: Secondary | ICD-10-CM | POA: Diagnosis not present

## 2021-10-12 DIAGNOSIS — Z1211 Encounter for screening for malignant neoplasm of colon: Secondary | ICD-10-CM | POA: Diagnosis not present

## 2021-10-12 LAB — HM COLONOSCOPY

## 2021-10-13 ENCOUNTER — Encounter: Payer: Self-pay | Admitting: *Deleted

## 2021-10-13 ENCOUNTER — Other Ambulatory Visit: Payer: Self-pay | Admitting: Gastroenterology

## 2021-10-13 DIAGNOSIS — R933 Abnormal findings on diagnostic imaging of other parts of digestive tract: Secondary | ICD-10-CM

## 2021-10-18 ENCOUNTER — Other Ambulatory Visit: Payer: Self-pay | Admitting: Gastroenterology

## 2021-10-18 ENCOUNTER — Ambulatory Visit
Admission: RE | Admit: 2021-10-18 | Discharge: 2021-10-18 | Disposition: A | Discharge status: Home / Self Care | Payer: BC Managed Care – PPO | Source: Ambulatory Visit | Attending: Gastroenterology | Admitting: Gastroenterology

## 2021-10-18 DIAGNOSIS — R1909 Other intra-abdominal and pelvic swelling, mass and lump: Secondary | ICD-10-CM | POA: Diagnosis not present

## 2021-10-18 DIAGNOSIS — N83209 Unspecified ovarian cyst, unspecified side: Secondary | ICD-10-CM

## 2021-10-18 DIAGNOSIS — R933 Abnormal findings on diagnostic imaging of other parts of digestive tract: Secondary | ICD-10-CM

## 2021-10-18 DIAGNOSIS — N888 Other specified noninflammatory disorders of cervix uteri: Secondary | ICD-10-CM | POA: Diagnosis not present

## 2021-10-18 DIAGNOSIS — K6389 Other specified diseases of intestine: Secondary | ICD-10-CM | POA: Diagnosis not present

## 2022-05-17 DIAGNOSIS — Z1231 Encounter for screening mammogram for malignant neoplasm of breast: Secondary | ICD-10-CM | POA: Diagnosis not present

## 2022-05-17 LAB — HM MAMMOGRAPHY

## 2022-05-23 DIAGNOSIS — F3281 Premenstrual dysphoric disorder: Secondary | ICD-10-CM | POA: Diagnosis not present

## 2022-05-23 DIAGNOSIS — N959 Unspecified menopausal and perimenopausal disorder: Secondary | ICD-10-CM | POA: Diagnosis not present

## 2022-05-25 DIAGNOSIS — Z713 Dietary counseling and surveillance: Secondary | ICD-10-CM | POA: Diagnosis not present

## 2022-06-01 DIAGNOSIS — Z713 Dietary counseling and surveillance: Secondary | ICD-10-CM | POA: Diagnosis not present

## 2022-06-15 DIAGNOSIS — Z713 Dietary counseling and surveillance: Secondary | ICD-10-CM | POA: Diagnosis not present

## 2022-08-07 DIAGNOSIS — H43812 Vitreous degeneration, left eye: Secondary | ICD-10-CM | POA: Diagnosis not present

## 2022-09-21 DIAGNOSIS — H43812 Vitreous degeneration, left eye: Secondary | ICD-10-CM | POA: Diagnosis not present

## 2022-10-03 ENCOUNTER — Other Ambulatory Visit: Payer: Self-pay | Admitting: Family Medicine

## 2022-10-04 DIAGNOSIS — Z1331 Encounter for screening for depression: Secondary | ICD-10-CM | POA: Diagnosis not present

## 2022-10-04 DIAGNOSIS — Z01411 Encounter for gynecological examination (general) (routine) with abnormal findings: Secondary | ICD-10-CM | POA: Diagnosis not present

## 2022-10-04 DIAGNOSIS — N816 Rectocele: Secondary | ICD-10-CM | POA: Diagnosis not present

## 2022-10-04 DIAGNOSIS — N959 Unspecified menopausal and perimenopausal disorder: Secondary | ICD-10-CM | POA: Diagnosis not present

## 2022-10-04 DIAGNOSIS — N952 Postmenopausal atrophic vaginitis: Secondary | ICD-10-CM | POA: Diagnosis not present

## 2022-11-20 ENCOUNTER — Ambulatory Visit: Payer: BC Managed Care – PPO | Admitting: Podiatry

## 2022-11-24 ENCOUNTER — Encounter: Payer: Self-pay | Admitting: Podiatry

## 2022-11-24 ENCOUNTER — Ambulatory Visit (INDEPENDENT_AMBULATORY_CARE_PROVIDER_SITE_OTHER): Payer: BC Managed Care – PPO

## 2022-11-24 ENCOUNTER — Ambulatory Visit (INDEPENDENT_AMBULATORY_CARE_PROVIDER_SITE_OTHER): Payer: BC Managed Care – PPO | Admitting: Podiatry

## 2022-11-24 DIAGNOSIS — M7751 Other enthesopathy of right foot: Secondary | ICD-10-CM

## 2022-11-24 DIAGNOSIS — M2021 Hallux rigidus, right foot: Secondary | ICD-10-CM

## 2022-11-24 MED ORDER — BETAMETHASONE SOD PHOS & ACET 6 (3-3) MG/ML IJ SUSP
3.0000 mg | Freq: Once | INTRAMUSCULAR | Status: AC
  Administered 2022-11-24: 3 mg via INTRA_ARTICULAR

## 2022-11-24 MED ORDER — METHYLPREDNISOLONE 4 MG PO TBPK: ORAL_TABLET | ORAL | 0 refills | Status: DC

## 2022-11-24 NOTE — Progress Notes (Signed)
   Chief Complaint  Patient presents with   Foot Pain    "My right foot this toe and all around it hurts.  It's been going on about a year." N - toe pain L - hallux right D - 1 year O - gradually worse C - ache, throbbing sharp pain A - walking, shoes T - roll on a frozen lacrosse ball, ice it    HPI: 57 y.o. female presenting today for gradual onset of right great toe pain.  Ongoing for about 1 year now.  The pain has progressively gotten worse.  She says that it feels stiff and she experiences pain now with most of her shoes.  Denies any history of injury.  She has not done anything for treatment  Past Medical History:  Diagnosis Date   Abnormal Papanicolaou smear of cervix    Colon polyps    Depression    Gastric ulcer    GERD (gastroesophageal reflux disease)    Spon abortion w/o complication     Past Surgical History:  Procedure Laterality Date   APPENDECTOMY     VAGINAL DELIVERY     twice    Allergies  Allergen Reactions   Codeine     REACTION: Itching   Povidone Iodine Rash    Pt. States had a reaction to topical iodine when giving blood. 15 years ago//kp     Physical Exam: General: The patient is alert and oriented x3 in no acute distress.  Dermatology: Skin is warm, dry and supple bilateral lower extremities.   Vascular: Palpable pedal pulses bilaterally. Capillary refill within normal limits.  No appreciable edema.  No erythema.  Neurological: Grossly intact via light touch  Musculoskeletal Exam: Tenderness with palpation and limited range of motion noted to the first MTP of the right foot compared to the contralateral limb.  Radiographic Exam RT foot 11/24/2022:  Normal osseous mineralization.  Subtle changes noted within the first metatarsal phalangeal joint with dorsal spurring at the metatarsal head.  Assessment/Plan of Care: 1.  Hallux limitus/first MTP capsulitis right  -Patient evaluated.  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 -Injection of 0.5 cc Celestone Soluspan injected in the first MTP right -Prescription for Medrol Dosepak -Recommend OTC Motrin as needed after completion of the Dosepak -Recommend good supportive shoes and sneakers.  Advised against going barefoot -Return to clinic as needed       Felecia Shelling, DPM [facility]  Dr. Felecia Shelling, DPM    [address] [place], [place] [address]                Office [phone]  Fax [phone]

## 2022-11-27 ENCOUNTER — Encounter: Payer: Self-pay | Admitting: Family Medicine

## 2022-11-27 ENCOUNTER — Ambulatory Visit (INDEPENDENT_AMBULATORY_CARE_PROVIDER_SITE_OTHER): Payer: BC Managed Care – PPO | Admitting: Family Medicine

## 2022-11-27 VITALS — BP 136/92 | HR 61 | Ht 64.5 in | Wt 187.7 lb

## 2022-11-27 DIAGNOSIS — Z780 Asymptomatic menopausal state: Secondary | ICD-10-CM

## 2022-11-27 DIAGNOSIS — Z7689 Persons encountering health services in other specified circumstances: Secondary | ICD-10-CM | POA: Insufficient documentation

## 2022-11-27 DIAGNOSIS — Z0000 Encounter for general adult medical examination without abnormal findings: Secondary | ICD-10-CM | POA: Insufficient documentation

## 2022-11-27 DIAGNOSIS — I10 Essential (primary) hypertension: Secondary | ICD-10-CM | POA: Diagnosis not present

## 2022-11-27 DIAGNOSIS — K219 Gastro-esophageal reflux disease without esophagitis: Secondary | ICD-10-CM | POA: Diagnosis not present

## 2022-11-27 DIAGNOSIS — Z23 Encounter for immunization: Secondary | ICD-10-CM | POA: Insufficient documentation

## 2022-11-27 DIAGNOSIS — E66811 Obesity, class 1: Secondary | ICD-10-CM

## 2022-11-27 DIAGNOSIS — Z1159 Encounter for screening for other viral diseases: Secondary | ICD-10-CM | POA: Diagnosis not present

## 2022-11-27 DIAGNOSIS — Z0001 Encounter for general adult medical examination with abnormal findings: Secondary | ICD-10-CM | POA: Diagnosis not present

## 2022-11-27 DIAGNOSIS — Z6831 Body mass index (BMI) 31.0-31.9, adult: Secondary | ICD-10-CM

## 2022-11-27 DIAGNOSIS — Z114 Encounter for screening for human immunodeficiency virus [HIV]: Secondary | ICD-10-CM | POA: Insufficient documentation

## 2022-11-27 DIAGNOSIS — E6609 Other obesity due to excess calories: Secondary | ICD-10-CM | POA: Diagnosis not present

## 2022-11-27 NOTE — Progress Notes (Signed)
New patient visit   Patient: Kendra Jimenez   DOB: 1965-05-05   57 y.o. Female  MRN: [id number] Visit Date: 11/27/2022  Today's healthcare provider: Jacky Kindle, FNP  Patient presents for new patient visit to establish care.  Introduced to Publishing rights manager role and practice setting.  All questions answered.  Discussed provider/patient relationship and expectations.  Chief Complaint  Patient presents with   Establish Care    Would like to discuss Menopause    Subjective    Kendra Jimenez is a 57 y.o. female who presents today as a new patient to establish care.  HPI HPI     Establish Care    Additional comments: Would like to discuss Menopause       Last edited by Rolly Salter, CMA on 11/27/2022  8:14 AM.      The patient, with a long-term history of being on Wellbutrin (300mg ), presents with uncertainty about the medication's effectiveness after 16 years of use. They express a desire to wean off the medication but are concerned about potential withdrawal symptoms. The patient has been experiencing menopausal symptoms, including hot flashes and weight gain, which they feel have not been adequately managed by the Wellbutrin.  In addition to these primary concerns, the patient also reports knee pain and foot problems. They have been managing these issues with exercises and a cortisone shot, respectively. Recently, the patient has noticed an increase in shortness of breath and a borderline high blood pressure reading, which they attribute to potential weight gain and stress. The patient also mentions a recent weight loss of 30 pounds through a diet program, but they have since regained the weight. Past Medical History:  Diagnosis Date   Abnormal Papanicolaou smear of cervix    Colon polyps    Depression    Gastric ulcer    GERD (gastroesophageal reflux disease)    Spon abortion w/o complication    Past Surgical History:  Procedure Laterality Date   APPENDECTOMY      VAGINAL DELIVERY     twice   Family Status  Relation Name Status   Mother  Alive   Father  Alive   Sister  Alive   MGM  Deceased   MGF  Deceased   PGM  Alive   PGF  Alive  No partnership data on file   Family History  Problem Relation Age of Onset   Diabetes Mother    Hypertension Mother    Cancer Father        kidney   Crohn's disease Father    Stroke Maternal Grandmother    Diabetes Maternal Grandmother    Stroke Maternal Grandfather    Social History   Socioeconomic History   Marital status: Married    Spouse name: Not on file   Number of children: Not on file   Years of education: Not on file   Highest education level: Not on file  Occupational History   Not on file  Tobacco Use   Smoking status: Former   Smokeless tobacco: Never  Vaping Use   Vaping status: Never Used  Substance and Sexual Activity   Alcohol use: Yes    Comment: 1 per month   Drug use: Not Currently    Types: Marijuana    Comment: college   Sexual activity: Yes    Birth control/protection: Condom  Other Topics Concern   Not on file  Social History Narrative   Not on file   Social Determinants  of Health   Financial Resource Strain: Not on file  Food Insecurity: Not on file  Transportation Needs: Not on file  Physical Activity: Not on file  Stress: Not on file  Social Connections: Not on file   Outpatient Medications Prior to Visit  Medication Sig   buPROPion (WELLBUTRIN XL) 300 MG 24 hr tablet Take 1 tablet by mouth daily.   PROMETRIUM 100 MG capsule Take 100 mg by mouth daily.   [DISCONTINUED] buPROPion (WELLBUTRIN XL) 300 MG 24 hr tablet Take 300 mg by mouth daily.   [DISCONTINUED] methylPREDNISolone (MEDROL DOSEPAK) 4 MG TBPK tablet 6 day dose pack - take as directed   estradiol (ESTRACE) 0.1 MG/GM vaginal cream Place 1 Applicatorful vaginally 2 (two) times a week. Size of blueberry   estradiol (VIVELLE-DOT) 0.075 MG/24HR Place 1 patch onto the skin 2 (two) times a week.    [DISCONTINUED] amoxicillin (AMOXIL) 875 MG tablet Take 1 tablet (875 mg total) by mouth 2 (two) times daily. (Patient not taking: Reported on 11/24/2022)   [DISCONTINUED] naproxen (NAPROSYN) 500 MG tablet Take 1 tablet (500 mg total) by mouth 2 (two) times daily with a meal. (Patient not taking: Reported on 11/24/2022)   [DISCONTINUED] Nutritional Supplements (JUICE PLUS FIBRE PO) Take by mouth daily. (Patient not taking: Reported on 11/24/2022)   No facility-administered medications prior to visit.   Allergies  Allergen Reactions   Codeine     REACTION: Itching   Povidone Iodine Rash    Pt. States had a reaction to topical iodine when giving blood. 15 years ago//kp    Immunization History  Administered Date(s) Administered   Influenza Split 01/11/2011, 11/23/2011, 02/17/2020   Influenza Whole 11/26/2007, 01/12/2009, 12/21/2009   Influenza, Seasonal, Injecte, Preservative Fre 11/27/2022   Influenza,inj,Quad PF,6+ Mos 12/25/2012, 11/30/2015, 01/15/2017, 01/23/2018   PFIZER(Purple Top)SARS-COV-2 Vaccination 05/19/2019, 06/17/2019   Td 02/13/2002   Tdap 11/27/2022   Zoster Recombinant(Shingrix) 11/09/2020    Health Maintenance  Topic Date Due   HIV Screening  Never done   Hepatitis C Screening  Never done   Cervical Cancer Screening (HPV/Pap Cotest)  03/24/2019   Zoster Vaccines- Shingrix (2 of 2) 01/04/2021   COVID-19 Vaccine (3 - 2023-24 season) 10/15/2022   MAMMOGRAM  04/21/2023   Colonoscopy  10/13/2026   DTaP/Tdap/Td (3 - Td or Tdap) 11/26/2032   INFLUENZA VACCINE  Completed   HPV VACCINES  Aged Out    Patient Care Team: Jacky Kindle, FNP as PCP - General (Family Medicine)  Objective    BP (!) 136/92   Pulse 61   Ht 5' 4.5" (1.638 m)   Wt 187 lb 11.2 oz (85.1 kg)   SpO2 100%   BMI 31.72 kg/m   Physical Exam Vitals and nursing note reviewed.  Constitutional:      General: She is awake. She is not in acute distress.    Appearance: Normal appearance. She is  well-developed and well-groomed. She is obese. She is not ill-appearing, toxic-appearing or diaphoretic.  HENT:     Head: Normocephalic and atraumatic.     Jaw: There is normal jaw occlusion. No trismus, tenderness, swelling or pain on movement.     Right Ear: Hearing, tympanic membrane, ear canal and external ear normal. There is no impacted cerumen.     Left Ear: Hearing, tympanic membrane, ear canal and external ear normal. There is no impacted cerumen.     Nose: Nose normal. No congestion or rhinorrhea.     Right Turbinates: Not enlarged,  swollen or pale.     Left Turbinates: Not enlarged, swollen or pale.     Right Sinus: No maxillary sinus tenderness or frontal sinus tenderness.     Left Sinus: No maxillary sinus tenderness or frontal sinus tenderness.     Mouth/Throat:     Lips: Pink.     Mouth: Mucous membranes are moist. No injury.     Tongue: No lesions.     Pharynx: Oropharynx is clear. Uvula midline. No pharyngeal swelling, oropharyngeal exudate, posterior oropharyngeal erythema or uvula swelling.     Tonsils: No tonsillar exudate or tonsillar abscesses.  Eyes:     General: Lids are normal. Lids are everted, no foreign bodies appreciated. Vision grossly intact. Gaze aligned appropriately. No allergic shiner or visual field deficit.       Right eye: No discharge.        Left eye: No discharge.     Extraocular Movements: Extraocular movements intact.     Conjunctiva/sclera: Conjunctivae normal.     Right eye: Right conjunctiva is not injected. No exudate.    Left eye: Left conjunctiva is not injected. No exudate.    Pupils: Pupils are equal, round, and reactive to light.  Neck:     Thyroid: No thyroid mass, thyromegaly or thyroid tenderness.     Vascular: No carotid bruit.     Trachea: Trachea normal.  Cardiovascular:     Rate and Rhythm: Normal rate and regular rhythm.     Pulses: Normal pulses.          Carotid pulses are 2+ on the right side and 2+ on the left side.       Radial pulses are 2+ on the right side and 2+ on the left side.       Dorsalis pedis pulses are 2+ on the right side and 2+ on the left side.       Posterior tibial pulses are 2+ on the right side and 2+ on the left side.     Heart sounds: Normal heart sounds, S1 normal and S2 normal. No murmur heard.    No friction rub. No gallop.  Pulmonary:     Effort: Pulmonary effort is normal. No respiratory distress.     Breath sounds: Normal breath sounds and air entry. No stridor. No wheezing, rhonchi or rales.     Comments: Endorses some DOE/SOB; no adventitious lung sounds Chest:     Chest wall: No tenderness.  Abdominal:     General: Abdomen is flat. Bowel sounds are normal. There is no distension.     Palpations: Abdomen is soft. There is no mass.     Tenderness: There is no abdominal tenderness. There is no right CVA tenderness, left CVA tenderness, guarding or rebound.     Hernia: No hernia is present.  Genitourinary:    Comments: Exam deferred; complaints of vaginal dryness- using both OTC and Rx medication to assist with associated symptoms iso sexual activity Musculoskeletal:        General: No swelling, tenderness, deformity or signs of injury. Normal range of motion.     Cervical back: Full passive range of motion without pain, normal range of motion and neck supple. No edema, rigidity or tenderness. No muscular tenderness.     Right lower leg: No edema.     Left lower leg: No edema.  Lymphadenopathy:     Cervical: No cervical adenopathy.     Right cervical: No superficial, deep or posterior cervical adenopathy.  Left cervical: No superficial, deep or posterior cervical adenopathy.  Skin:    General: Skin is warm and dry.     Capillary Refill: Capillary refill takes less than 2 seconds.     Coloration: Skin is not jaundiced or pale.     Findings: No bruising, erythema, lesion or rash.  Neurological:     General: No focal deficit present.     Mental Status: She is alert and  oriented to person, place, and time. Mental status is at baseline.     GCS: GCS eye subscore is 4. GCS verbal subscore is 5. GCS motor subscore is 6.     Sensory: Sensation is intact. No sensory deficit.     Motor: Motor function is intact. No weakness.     Coordination: Coordination is intact. Coordination normal.     Gait: Gait is intact. Gait normal.  Psychiatric:        Attention and Perception: Attention and perception normal.        Mood and Affect: Mood and affect normal.        Speech: Speech normal.        Behavior: Behavior normal. Behavior is cooperative.        Thought Content: Thought content normal.        Cognition and Memory: Cognition and memory normal.        Judgment: Judgment normal.    Depression Screen    11/27/2022    8:27 AM  PHQ 2/9 Scores  PHQ - 2 Score 2  PHQ- 9 Score 7   No results found for any visits on 11/27/22.  Assessment & Plan     Depression Long-term use of Wellbutrin 300mg  with unclear efficacy. Discussed potential weaning off the medication or increasing the dose to 450mg . Patient reports persistent binge eating behaviors. -Consider increasing Wellbutrin to 450mg  daily or initiate a 4-6 week wean off the medication.  Menopausal Symptoms Reports hot flashes and sleep disturbances. Previously on Estrace cream and Estradiol patches with progesterone, but discontinued due to feeling unwell. Over-the-counter treatments used for vaginal dryness. -Consider resuming estrogen replacement therapy to manage symptoms.  Hypertension Borderline elevated diastolic blood pressure. Discussed potential causes and lifestyle modifications. -Recommend Dietary Approaches to Stop Hypertension (DASH) diet and purposeful exercise. -Consider obtaining a home blood pressure monitor to track blood pressure. -Plan to reassess in 3 months.  General Health Maintenance -Order labs including complete blood count, blood chemistry, thyroid function tests, lipid profile,  blood glucose, and vitamin D levels. -Administer TDAP and Flu vaccines today. -Screen for HIV and Hepatitis C.  I, Jacky Kindle, FNP, have reviewed all documentation for this visit. The documentation on 11/27/22 for the exam, diagnosis, procedures, and orders are all accurate and complete.  Jacky Kindle, FNP  [facility] [phone] (phone) [phone] (fax)  [facility]

## 2022-11-28 LAB — CBC WITH DIFFERENTIAL/PLATELET
Basophils Absolute: 0 10*3/uL (ref 0.0–0.2)
Basos: 0 %
EOS (ABSOLUTE): 0 10*3/uL (ref 0.0–0.4)
Eos: 0 %
Hematocrit: 40.8 % (ref 34.0–46.6)
Hemoglobin: 12.9 g/dL (ref 11.1–15.9)
Immature Grans (Abs): 0 10*3/uL (ref 0.0–0.1)
Immature Granulocytes: 0 %
Lymphocytes Absolute: 1.7 10*3/uL (ref 0.7–3.1)
Lymphs: 24 %
MCH: 28.5 pg (ref 26.6–33.0)
MCHC: 31.6 g/dL (ref 31.5–35.7)
MCV: 90 fL (ref 79–97)
Monocytes Absolute: 0.6 10*3/uL (ref 0.1–0.9)
Monocytes: 9 %
Neutrophils Absolute: 4.9 10*3/uL (ref 1.4–7.0)
Neutrophils: 67 %
Platelets: 257 10*3/uL (ref 150–450)
RBC: 4.53 x10E6/uL (ref 3.77–5.28)
RDW: 11.8 % (ref 11.7–15.4)
WBC: 7.3 10*3/uL (ref 3.4–10.8)

## 2022-11-28 LAB — COMPREHENSIVE METABOLIC PANEL
ALT: 19 [IU]/L (ref 0–32)
AST: 21 [IU]/L (ref 0–40)
Albumin: 4.6 g/dL (ref 3.8–4.9)
Alkaline Phosphatase: 75 [IU]/L (ref 44–121)
BUN/Creatinine Ratio: 20 (ref 9–23)
BUN: 20 mg/dL (ref 6–24)
Bilirubin Total: 0.4 mg/dL (ref 0.0–1.2)
CO2: 24 mmol/L (ref 20–29)
Calcium: 9.4 mg/dL (ref 8.7–10.2)
Chloride: 102 mmol/L (ref 96–106)
Creatinine, Ser: 1 mg/dL (ref 0.57–1.00)
Globulin, Total: 2.7 g/dL (ref 1.5–4.5)
Glucose: 82 mg/dL (ref 70–99)
Potassium: 4.7 mmol/L (ref 3.5–5.2)
Sodium: 139 mmol/L (ref 134–144)
Total Protein: 7.3 g/dL (ref 6.0–8.5)
eGFR: 67 mL/min/{1.73_m2} (ref >59)

## 2022-11-28 LAB — LIPID PANEL
Chol/HDL Ratio: 3.1 {ratio} (ref 0.0–4.4)
Cholesterol, Total: 226 mg/dL — ABNORMAL HIGH (ref 100–199)
HDL: 74 mg/dL (ref >39)
LDL Chol Calc (NIH): 133 mg/dL — ABNORMAL HIGH (ref 0–99)
Triglycerides: 107 mg/dL (ref 0–149)
VLDL Cholesterol Cal: 19 mg/dL (ref 5–40)

## 2022-11-28 LAB — VITAMIN D 25 HYDROXY (VIT D DEFICIENCY, FRACTURES): Vit D, 25-Hydroxy: 43.5 ng/mL (ref 30.0–100.0)

## 2022-11-28 LAB — HEPATITIS C ANTIBODY: Hep C Virus Ab: NONREACTIVE

## 2022-11-28 LAB — TSH: TSH: 2.07 u[IU]/mL (ref 0.450–4.500)

## 2022-11-28 LAB — HEMOGLOBIN A1C
Est. average glucose Bld gHb Est-mCnc: 105 mg/dL
Hgb A1c MFr Bld: 5.3 % (ref 4.8–5.6)

## 2022-11-28 LAB — HIV ANTIBODY (ROUTINE TESTING W REFLEX): HIV Screen 4th Generation wRfx: NONREACTIVE

## 2023-02-27 ENCOUNTER — Ambulatory Visit (INDEPENDENT_AMBULATORY_CARE_PROVIDER_SITE_OTHER): Payer: BC Managed Care – PPO | Admitting: Family Medicine

## 2023-02-27 ENCOUNTER — Encounter: Payer: Self-pay | Admitting: Family Medicine

## 2023-02-27 ENCOUNTER — Ambulatory Visit: Payer: BC Managed Care – PPO | Admitting: Family Medicine

## 2023-02-27 VITALS — BP 104/77 | HR 66 | Ht 64.0 in | Wt 184.7 lb

## 2023-02-27 DIAGNOSIS — E66811 Obesity, class 1: Secondary | ICD-10-CM | POA: Diagnosis not present

## 2023-02-27 DIAGNOSIS — I10 Essential (primary) hypertension: Secondary | ICD-10-CM | POA: Diagnosis not present

## 2023-02-27 DIAGNOSIS — E6609 Other obesity due to excess calories: Secondary | ICD-10-CM

## 2023-02-27 DIAGNOSIS — Z6831 Body mass index (BMI) 31.0-31.9, adult: Secondary | ICD-10-CM

## 2023-02-27 DIAGNOSIS — E7800 Pure hypercholesterolemia, unspecified: Secondary | ICD-10-CM | POA: Diagnosis not present

## 2023-02-27 DIAGNOSIS — F339 Major depressive disorder, recurrent, unspecified: Secondary | ICD-10-CM | POA: Diagnosis not present

## 2023-02-27 NOTE — Assessment & Plan Note (Addendum)
 Previously elevated blood pressure stage 1 HTN, now well controlled with lifestyle modifications including diet changes. No symptoms of hypertension reported. BP today - 104/77 - normotensive Goal SBP<119; DBP<80 -Continue current lifestyle modifications. -Check blood pressure at next visit. -Recommend weekly BP checks with at home upper cuff automatic monitor - f/u in 6 months for physical

## 2023-02-27 NOTE — Assessment & Plan Note (Deleted)
 Continue mood mgmt and menopause mgmt with GYN provider, Viviann Spare, MD.  Discussed can make a Wellbutrin taper plan if pt decides to decrease med. Continue daily Wellbutrin 300mg  XR

## 2023-02-27 NOTE — Assessment & Plan Note (Addendum)
 Continue mood mgmt and menopause mgmt with GYN provider, Burnard File, MD. Depression stable, but pt conflicted with wanting to continue med. Has been on for 16 years- well managed.   Discussed can make a Wellbutrin taper plan if pt decides to decrease med. Continue daily Wellbutrin 300mg  XR

## 2023-02-27 NOTE — Assessment & Plan Note (Signed)
 Previously noted elevated LDL and cholesterol on lipid panel in October 2024.  Will recheck today Pt has made dietary changes and has fasted today.  Continue - decrease in saturated and processed foods May take daily omega 3 supplement

## 2023-02-27 NOTE — Assessment & Plan Note (Signed)
 Continue lifestyle changes -Decrease saturated foods/ processed foods - Increase daily exercise purpose walking and strength training - keep up good work! - BP controlled today, normotensive

## 2023-02-27 NOTE — Progress Notes (Signed)
 Established Patient Office Visit  Introduced to nurse practitioner role and practice setting.  All questions answered.  Discussed provider/patient relationship and expectations.   Subjective   Patient ID: Kendra Jimenez, female    DOB: 02/14/1966  Age: 58 y.o. MRN: [id number]  Chief Complaint  Patient presents with   Hypertension    A 58 year old patient with a history of elevated BP, hyperlipidemia, and menopause presented for chronic disease management. The patient had a previous visit with elevated blood pressure but was not on any medications. Since the last visit, the patient reported making dietary changes, including increased consumption of fruits, vegetables, and lean proteins, and reduced intake of fried foods and ice cream. The patient reported feeling better since making these changes.  The patient's blood pressure was noted to be within normal limits at this visit. However, the patient had not been monitoring blood pressure at home. The patient denied experiencing any headaches or vision changes.  The patient's cholesterol was previously noted to be high-.reported intermittent fasting and had only consumed coffee and cough drop today.  The patient has been on Wellbutrin for approximately 16 years, initially started for postpartum depression. The patient expressed a desire to potentially taper off this medication but also expressed fear about potential mood changes. The patient also reported being on a hormone patch and progesterone  for menopausal symptoms, which seemed to be well-managed. These meds are managed by OB/GYN provider, Burnard File, MD- states UTD with pap.        02/27/2023    8:32 AM 11/27/2022    8:27 AM  Depression screen PHQ 2/9  Decreased Interest 1 1  Down, Depressed, Hopeless 1 1  PHQ - 2 Score 2 2  Altered sleeping 1 2  Tired, decreased energy 1 1  Change in appetite 1 1  Feeling bad or failure about yourself  0 1  Trouble concentrating 0 0   Moving slowly or fidgety/restless 0 0  Suicidal thoughts 0 0  PHQ-9 Score 5 7  Difficult doing work/chores Not difficult at all        02/27/2023    8:32 AM 11/27/2022    8:27 AM  GAD 7 : Generalized Anxiety Score  Nervous, Anxious, on Edge 0 0  Control/stop worrying 0 0  Worry too much - different things 0 0  Trouble relaxing 0 0  Restless 0 0  Easily annoyed or irritable 1 1  Afraid - awful might happen 0 0  Total GAD 7 Score 1 1  Anxiety Difficulty Not difficult at all      Review of Systems  All other systems reviewed and are negative.   Negative unless indicated in HPI   Objective:     BP 104/77 (BP Location: Right Arm, Patient Position: Sitting, Cuff Size: Normal)   Pulse 66   Ht 5' 4 (1.626 m)   Wt 184 lb 11.2 oz (83.8 kg)   SpO2 100%   BMI 31.70 kg/m    Physical Exam Vitals reviewed.  Constitutional:      General: She is not in acute distress.    Appearance: Normal appearance. She is not ill-appearing, toxic-appearing or diaphoretic.  HENT:     Mouth/Throat:     Mouth: Mucous membranes are moist.     Pharynx: Oropharynx is clear.  Eyes:     Extraocular Movements: Extraocular movements intact.     Conjunctiva/sclera: Conjunctivae normal.     Pupils: Pupils are equal, round, and reactive to light.  Cardiovascular:  Rate and Rhythm: Normal rate and regular rhythm.     Pulses: Normal pulses.     Heart sounds: Normal heart sounds. No murmur heard.    No friction rub. No gallop.  Pulmonary:     Effort: No respiratory distress.     Breath sounds: Normal breath sounds. No stridor. No wheezing or rhonchi.  Chest:     Chest wall: No tenderness.  Musculoskeletal:     Right lower leg: No edema.     Left lower leg: No edema.  Skin:    General: Skin is warm and dry.     Capillary Refill: Capillary refill takes less than 2 seconds.  Neurological:     General: No focal deficit present.     Mental Status: She is alert and oriented to person, place,  and time. Mental status is at baseline.     Cranial Nerves: No cranial nerve deficit.     Sensory: No sensory deficit.     Motor: No weakness.     Coordination: Coordination normal.     Gait: Gait normal.     Deep Tendon Reflexes: Reflexes normal.  Psychiatric:        Mood and Affect: Mood normal.        Behavior: Behavior normal.        Thought Content: Thought content normal.        Judgment: Judgment normal.     No results found for any visits on 02/27/23.    The 10-year ASCVD risk score (Arnett DK, et al., 2019) is: 1.1%    Assessment & Plan:  Elevated LDL cholesterol level Assessment & Plan: Previously noted elevated LDL and cholesterol on lipid panel in October 2024.  Will recheck today Pt has made dietary changes and has fasted today.  Continue - decrease in saturated and processed foods May take daily omega 3 supplement  Orders: -     Lipid panel  Primary hypertension Assessment & Plan: Previously elevated blood pressure stage 1 HTN, now well controlled with lifestyle modifications including diet changes. No symptoms of hypertension reported. BP today - 104/77 - normotensive Goal SBP<119; DBP<80 -Continue current lifestyle modifications. -Check blood pressure at next visit. -Recommend weekly BP checks with at home upper cuff automatic monitor - f/u in 6 months for physical   Class 1 obesity due to excess calories with serious comorbidity and body mass index (BMI) of 31.0 to 31.9 in adult Assessment & Plan: Continue lifestyle changes -Decrease saturated foods/ processed foods - Increase daily exercise purpose walking and strength training - keep up good work! - BP controlled today, normotensive   Depression, recurrent (HCC) Assessment & Plan: Continue mood mgmt and menopause mgmt with GYN provider, Burnard File, MD. Depression stable, but pt conflicted with wanting to continue med. Has been on for 16 years- well managed.   Discussed can make a  Wellbutrin taper plan if pt decides to decrease med. Continue daily Wellbutrin 300mg  XR     Return in about 6 months (around 08/27/2023) for annual physical.   I, Curtis DELENA Boom, FNP, have reviewed all documentation for this visit. The documentation on 02/27/23 for the exam, diagnosis, procedures, and orders are all accurate and complete.  Curtis DELENA Boom, FNP

## 2023-02-28 LAB — LIPID PANEL
Chol/HDL Ratio: 3.1 {ratio} (ref 0.0–4.4)
Cholesterol, Total: 223 mg/dL — ABNORMAL HIGH (ref 100–199)
HDL: 73 mg/dL (ref >39)
LDL Chol Calc (NIH): 132 mg/dL — ABNORMAL HIGH (ref 0–99)
Triglycerides: 105 mg/dL (ref 0–149)
VLDL Cholesterol Cal: 18 mg/dL (ref 5–40)

## 2023-06-14 LAB — HM MAMMOGRAPHY

## 2023-08-28 ENCOUNTER — Ambulatory Visit (INDEPENDENT_AMBULATORY_CARE_PROVIDER_SITE_OTHER): Payer: Self-pay | Admitting: Family Medicine

## 2023-08-28 DIAGNOSIS — Z5321 Procedure and treatment not carried out due to patient leaving prior to being seen by health care provider: Secondary | ICD-10-CM

## 2023-08-29 NOTE — Progress Notes (Signed)
 Canceled appt.

## 2023-11-28 ENCOUNTER — Encounter: Payer: Self-pay | Admitting: Family Medicine

## 2023-11-28 ENCOUNTER — Ambulatory Visit: Admitting: Family Medicine

## 2023-11-28 ENCOUNTER — Ambulatory Visit: Attending: Family Medicine

## 2023-11-28 VITALS — BP 114/88 | HR 62 | Ht 64.0 in | Wt 172.3 lb

## 2023-11-28 DIAGNOSIS — Z0000 Encounter for general adult medical examination without abnormal findings: Secondary | ICD-10-CM

## 2023-11-28 DIAGNOSIS — Z0001 Encounter for general adult medical examination with abnormal findings: Secondary | ICD-10-CM

## 2023-11-28 DIAGNOSIS — E7800 Pure hypercholesterolemia, unspecified: Secondary | ICD-10-CM

## 2023-11-28 DIAGNOSIS — R002 Palpitations: Secondary | ICD-10-CM

## 2023-11-28 DIAGNOSIS — Z780 Asymptomatic menopausal state: Secondary | ICD-10-CM

## 2023-11-28 DIAGNOSIS — Z23 Encounter for immunization: Secondary | ICD-10-CM | POA: Diagnosis not present

## 2023-11-28 DIAGNOSIS — R1013 Epigastric pain: Secondary | ICD-10-CM

## 2023-11-28 DIAGNOSIS — R030 Elevated blood-pressure reading, without diagnosis of hypertension: Secondary | ICD-10-CM | POA: Insufficient documentation

## 2023-11-28 DIAGNOSIS — K219 Gastro-esophageal reflux disease without esophagitis: Secondary | ICD-10-CM

## 2023-11-28 DIAGNOSIS — R42 Dizziness and giddiness: Secondary | ICD-10-CM | POA: Diagnosis not present

## 2023-11-28 DIAGNOSIS — F339 Major depressive disorder, recurrent, unspecified: Secondary | ICD-10-CM

## 2023-11-28 DIAGNOSIS — I10 Essential (primary) hypertension: Secondary | ICD-10-CM

## 2023-11-28 NOTE — Progress Notes (Signed)
 Complete physical exam  Patient: Kendra Jimenez   DOB: July 21, 1965   58 y.o. Female  MRN: [id number]  Introduced to nurse practitioner role and practice setting.  All questions answered.  Discussed provider/patient relationship and expectations.   Subjective:    Chief Complaint  Patient presents with   Annual Exam    Last completed 11/27/22 Diet -  low carb Exercise - walking dog daily for a mile and a half but tries to do more at least 3 times a week  Feeling - fairly well Sleeping - well Concerns -  feels like BP is high at time due to lightheadedness or just feeling the need to calm down   Care Management    Cervical Cancer Screening see GYN Influenza ok to order Pneumococcal Vaccine declined today Hepatitis B Vaccine check antibodies   Kendra Jimenez is a 58 y.o. female who presents today for a complete physical exam. She reports consuming a general diet. The patient does not participate in regular exercise at present. She generally feels fairly well. She reports sleeping well. She does not have additional problems to discuss today.   Discussed the use of AI scribe software for clinical note transcription with the patient, who gave verbal consent to proceed.  History of Present Illness Kendra Jimenez is a 58 year old female who presents for physical, but does have concerns today for intermittent lightheadedness and heartburn.  She experiences lightheadedness primarily in the mornings, described as dizziness and a sensation of her heart racing. These symptoms occur after she gets up and starts her morning routine, particularly when making coffee. The symptoms improve after sitting down and taking deep breaths. She experiences these symptoms intermittently throughout the day, especially after climbing stairs. She has not checked her blood pressure at home. She associates her symptoms with feeling tense. No history of syncope.  She also experiences heartburn, which she attributes  to reflux. She takes Tums and an over-the-counter famotidine (Pepcid) for relief. She experiences pain in her stomach area with heartburn, sometimes requiring her to lay down or stretch to alleviate the discomfort. No chest pain, numbness, or sweating associated with these episodes.  Her past medical history includes hormone replacement therapy, for which she takes a progesterone  pill at night and uses an estrogen patch. She is not currently using the esterase for vaginal dryness- OB/Gyn prescribes  Her family history includes high blood pressure, as her mother has the condition. She wants to avoid medication for blood pressure management.  Most recent fall risk assessment:    11/27/2022    9:07 AM  Fall Risk   Falls in the past year? 0  Number falls in past yr: 0  Injury with Fall? 0     Most recent depression screenings:    11/28/2023    9:18 AM 02/27/2023    8:32 AM  PHQ 2/9 Scores  PHQ - 2 Score 4 2  PHQ- 9 Score 5 5      11/28/2023    9:18 AM 02/27/2023    8:32 AM 11/27/2022    8:27 AM  GAD 7 : Generalized Anxiety Score  Nervous, Anxious, on Edge 0 0 0  Control/stop worrying 0 0 0  Worry too much - different things 0 0 0  Trouble relaxing 0 0 0  Restless 0 0 0  Easily annoyed or irritable 0 1 1  Afraid - awful might happen 0 0 0  Total GAD 7 Score 0 1 1  Anxiety Difficulty Not  difficult at all Not difficult at all       Vision:Within last year and Dental: No current dental problems  Patient Active Problem List   Diagnosis Date Noted   Elevated LDL cholesterol level 02/27/2023   Depression, recurrent 02/27/2023   Annual physical exam 11/27/2022   Class 1 obesity due to excess calories with serious comorbidity and body mass index (BMI) of 31.0 to 31.9 in adult 11/27/2022   Primary hypertension 11/27/2022   Post-menopausal 11/27/2022   GERD 11/26/2007   ECZEMA, ATOPIC 11/26/2007   Past Medical History:  Diagnosis Date   Abnormal Papanicolaou smear of cervix     Colon polyps    Depression    Gastric ulcer    GERD (gastroesophageal reflux disease)    Spon abortion w/o complication    Past Surgical History:  Procedure Laterality Date   APPENDECTOMY     VAGINAL DELIVERY     twice   Allergies  Allergen Reactions   Codeine Itching, Hives and Other (See Comments)    REACTION: Itching   Iodine Other (See Comments)    iodine   Povidone Iodine Rash    Pt. States had a reaction to topical iodine when giving blood. 15 years ago//kp      Patient Care Team: Wellington Curtis LABOR, FNP as PCP - General (Family Medicine) Kandyce Sor, MD as Consulting Physician (Obstetrics and Gynecology)   Outpatient Medications Prior to Visit  Medication Sig   buPROPion (WELLBUTRIN XL) 300 MG 24 hr tablet Take 1 tablet by mouth daily.   estradiol  (ESTRACE ) 0.1 MG/GM vaginal cream Place 1 Applicatorful vaginally 2 (two) times a week. Size of blueberry   estradiol  (VIVELLE -DOT) 0.075 MG/24HR Place 1 patch onto the skin 2 (two) times a week.   PROMETRIUM  100 MG capsule Take 100 mg by mouth daily.   No facility-administered medications prior to visit.    Review of Systems  All other systems reviewed and are negative.         Objective:     BP 114/88 (BP Location: Left Arm, Patient Position: Sitting, Cuff Size: Normal)   Pulse 62   Ht 5' 4 (1.626 m)   Wt 172 lb 4.8 oz (78.2 kg)   SpO2 100%   BMI 29.58 kg/m  BP Readings from Last 3 Encounters:  11/28/23 114/88  02/27/23 104/77  11/27/22 (!) 136/92   Wt Readings from Last 3 Encounters:  11/28/23 172 lb 4.8 oz (78.2 kg)  02/27/23 184 lb 11.2 oz (83.8 kg)  11/27/22 187 lb 11.2 oz (85.1 kg)      Physical Exam Constitutional:      General: She is not in acute distress.    Appearance: Normal appearance. She is not ill-appearing, toxic-appearing or diaphoretic.  HENT:     Head: Normocephalic.     Right Ear: Tympanic membrane normal.     Left Ear: Tympanic membrane normal.     Nose: Nose  normal.     Mouth/Throat:     Mouth: Mucous membranes are moist.     Pharynx: Oropharynx is clear. No oropharyngeal exudate or posterior oropharyngeal erythema.  Eyes:     General: Lids are normal.     Extraocular Movements: Extraocular movements intact.     Right eye: Normal extraocular motion.     Left eye: Normal extraocular motion.     Conjunctiva/sclera: Conjunctivae normal.     Right eye: Right conjunctiva is not injected.     Left eye: Left conjunctiva is not  injected.     Pupils: Pupils are equal, round, and reactive to light.  Neck:     Thyroid : No thyroid  mass, thyromegaly or thyroid  tenderness.     Vascular: No carotid bruit.  Cardiovascular:     Rate and Rhythm: Normal rate and regular rhythm.     Pulses: Normal pulses.          Radial pulses are 2+ on the right side and 2+ on the left side.       Posterior tibial pulses are 2+ on the right side and 2+ on the left side.     Heart sounds: Normal heart sounds, S1 normal and S2 normal. No murmur heard.    No friction rub. No gallop.  Pulmonary:     Effort: Pulmonary effort is normal. No respiratory distress.     Breath sounds: Normal breath sounds. No stridor. No wheezing, rhonchi or rales.  Chest:     Chest wall: No tenderness.     Comments: UTD on mammo - no breast concerns today Abdominal:     General: Bowel sounds are normal. There is no distension.     Palpations: Abdomen is soft. There is no mass.     Tenderness: There is no abdominal tenderness. There is no guarding or rebound.     Hernia: No hernia is present.  Genitourinary:    Comments: Gets PAP through OB/Gyn, no concerns today Musculoskeletal:        General: No swelling or tenderness. Normal range of motion.     Cervical back: Normal range of motion. No rigidity.     Right lower leg: No edema.     Left lower leg: No edema.  Lymphadenopathy:     Cervical: No cervical adenopathy.     Right cervical: No superficial, deep or posterior cervical  adenopathy.    Left cervical: No superficial, deep or posterior cervical adenopathy.  Skin:    General: Skin is warm and dry.     Capillary Refill: Capillary refill takes less than 2 seconds.     Findings: No bruising or erythema.  Neurological:     General: No focal deficit present.     Mental Status: She is alert and oriented to person, place, and time. Mental status is at baseline.     GCS: GCS eye subscore is 4. GCS verbal subscore is 5. GCS motor subscore is 6.     Cranial Nerves: No cranial nerve deficit.     Sensory: No sensory deficit.     Motor: No weakness, tremor or pronator drift.     Coordination: Romberg sign negative. Coordination normal.     Gait: Gait is intact. Gait normal.  Psychiatric:        Attention and Perception: Attention and perception normal.        Mood and Affect: Mood and affect normal.        Speech: Speech normal.        Behavior: Behavior normal. Behavior is cooperative.        Thought Content: Thought content normal.        Cognition and Memory: Cognition and memory normal.        Judgment: Judgment normal.      No results found for any visits on 11/28/23.     Assessment & Plan:    Routine Health Maintenance and Physical Exam  Assessment and Plan Assessment & Plan  Annual Physical Things to do to keep yourself healthy  - Exercise at least  30-45 minutes a day, 3-4 days a week.  - Eat a low-fat diet with lots of fruits and vegetables, up to 7-9 servings per day.  - Seatbelts can save your life. Wear them always.  - Smoke detectors on every level of your home, check batteries every year.  - Eye Doctor - have an eye exam every 1-2 years  - Safe sex - if you may be exposed to STDs, use a condom.  - Alcohol -  If you drink, do it moderately, less than 2 drinks per day.  - Health Care Power of Attorney. Choose someone to speak for you if you are not able.  - Depression is common in our stressful world.If you're feeling down or losing interest  in things you normally enjoy, please come in for a visit.  - Violence - If anyone is threatening or hurting you, please call immediately.   Does have concerns today for intermittent palpitations and concerns about BP.   Will check titer for Hep B immunity today to see if she is due for vaccine or not.  Due for PAP - pt gets them through OB/GYN  UTD on mammo and colonoscopy.   Lightheadedness and palpitations Intermittent lightheadedness and palpitations, primarily in the mornings and occasionally during the day, with dizziness and a sensation of heart racing. No syncope. Possible contributing factors include anxiety related to life stressors, dehydration, blood pressure changes, cardiac rhythm changes - Monitor blood pressure at home, especially during symptomatic episodes. - Order Zio patch for home heart monitoring for two weeks to evaluate for arrhythmias. - Advise lifestyle modifications: reduce sodium intake, limit caffeine to 200 mg per day, and practice mindfulness and deep breathing exercises upon waking. - check CMP, TSH, CBC  Gastroesophageal reflux disease Reports heartburn with epigastric pain, possibly related to gastroesophageal reflux disease. Pain sometimes relieved by changing posture. Differential diagnosis includes musculoskeletal pain, cardiac-related pain, or gastric ulcer. Denies chest pain today. - Trial of over-the-counter omeprazole  20 mg daily for 4 weeks to assess symptom improvement. - Consider abdominal ultrasound if symptoms persist or worsen or referral to GI  Hyperlipidemia Previously elevated cholesterol levels - lifestyle managed at this time - continue exercise and well balanced diet - Perform lipid panel as part of routine lab work.  Post- menopausal - HRT, prescribed by OB/Gyn - estrogen patch and po progesterone  pill for uterine protection.  - intermittent use of estrace  cream  Depression - managed with Wellbutrin 150mg  daily - OB/Gyn  prescribes and manages  HTN - managed through lifestyle, maybe clinic related -Goal SBP<119; DBP<80 -Continue current lifestyle modifications. - Previous well controlled, 104/77 - pt little stressed today, DBP elevated - does not monitor at home -Recommend weekly BP checks with at home upper cuff automatic monitor - CMP today        Health Maintenance  Topic Date Due   Hepatitis B Vaccine (1 of 3 - 19+ 3-dose series) Never done   Pneumococcal Vaccine for age over 52 (1 of 1 - PCV) Never done   Pap with HPV screening  03/24/2019   COVID-19 Vaccine (3 - 2025-26 season) 10/15/2023   Breast Cancer Screening  06/13/2025   Colon Cancer Screening  10/13/2026   DTaP/Tdap/Td vaccine (6 - Td or Tdap) 11/26/2032   Flu Shot  Completed   Hepatitis C Screening  Completed   HIV Screening  Completed   Zoster (Shingles) Vaccine  Completed   HPV Vaccine  Aged Out   Meningitis B Vaccine  Aged Out    Discussed health benefits of physical activity, and encouraged her to engage in regular exercise appropriate for her age and condition.  Immunization due -     Flu vaccine trivalent PF, 6mos and older(Flulaval,Afluria,Fluarix,Fluzone)  Annual physical exam -     CBC -     Comprehensive metabolic panel with GFR -     Hemoglobin A1c -     VITAMIN D  25 Hydroxy (Vit-D Deficiency, Fractures) -     TSH -     Lipid panel -     Hepatitis B surface antibody,qualitative  Palpitations -     CBC -     Comprehensive metabolic panel with GFR -     TSH -     LONG TERM MONITOR (3-14 DAYS); Future  Epigastric pain -     LONG TERM MONITOR (3-14 DAYS); Future  Intermittent lightheadedness -     CBC -     Comprehensive metabolic panel with GFR -     TSH  Elevated blood-pressure reading without diagnosis of hypertension  Elevated LDL cholesterol level  Post-menopausal  Gastroesophageal reflux disease without esophagitis  Depression, recurrent  Primary hypertension     Return for  annual physical or if symptoms discussed today worsen.   I, Curtis DELENA Boom, FNP, have reviewed all documentation for this visit. The documentation on 11/28/23 for the exam, diagnosis, procedures, and orders are all accurate and complete.   Curtis DELENA Boom, FNP

## 2023-11-29 ENCOUNTER — Ambulatory Visit: Payer: Self-pay | Admitting: Family Medicine

## 2023-11-29 LAB — CBC
Hematocrit: 40 % (ref 34.0–46.6)
Hemoglobin: 12.8 g/dL (ref 11.1–15.9)
MCH: 29.8 pg (ref 26.6–33.0)
MCHC: 32 g/dL (ref 31.5–35.7)
MCV: 93 fL (ref 79–97)
Platelets: 264 x10E3/uL (ref 150–450)
RBC: 4.3 x10E6/uL (ref 3.77–5.28)
RDW: 12.5 % (ref 11.7–15.4)
WBC: 5.9 x10E3/uL (ref 3.4–10.8)

## 2023-11-29 LAB — COMPREHENSIVE METABOLIC PANEL WITH GFR
ALT: 18 IU/L (ref 0–32)
AST: 21 IU/L (ref 0–40)
Albumin: 4.4 g/dL (ref 3.8–4.9)
Alkaline Phosphatase: 65 IU/L (ref 49–135)
BUN/Creatinine Ratio: 19 (ref 9–23)
BUN: 20 mg/dL (ref 6–24)
Bilirubin Total: 0.3 mg/dL (ref 0.0–1.2)
CO2: 23 mmol/L (ref 20–29)
Calcium: 9.1 mg/dL (ref 8.7–10.2)
Chloride: 99 mmol/L (ref 96–106)
Creatinine, Ser: 1.05 mg/dL — ABNORMAL HIGH (ref 0.57–1.00)
Globulin, Total: 2.7 g/dL (ref 1.5–4.5)
Glucose: 77 mg/dL (ref 70–99)
Potassium: 4.5 mmol/L (ref 3.5–5.2)
Sodium: 137 mmol/L (ref 134–144)
Total Protein: 7.1 g/dL (ref 6.0–8.5)
eGFR: 62 mL/min/1.73 (ref >59)

## 2023-11-29 LAB — LIPID PANEL
Chol/HDL Ratio: 3 ratio (ref 0.0–4.4)
Cholesterol, Total: 187 mg/dL (ref 100–199)
HDL: 63 mg/dL (ref >39)
LDL Chol Calc (NIH): 111 mg/dL — ABNORMAL HIGH (ref 0–99)
Triglycerides: 71 mg/dL (ref 0–149)
VLDL Cholesterol Cal: 13 mg/dL (ref 5–40)

## 2023-11-29 LAB — HEPATITIS B SURFACE ANTIBODY,QUALITATIVE: Hep B Surface Ab, Qual: NONREACTIVE

## 2023-11-29 LAB — VITAMIN D 25 HYDROXY (VIT D DEFICIENCY, FRACTURES): Vit D, 25-Hydroxy: 42.4 ng/mL (ref 30.0–100.0)

## 2023-11-29 LAB — HEMOGLOBIN A1C
Est. average glucose Bld gHb Est-mCnc: 100 mg/dL
Hgb A1c MFr Bld: 5.1 % (ref 4.8–5.6)

## 2023-11-29 LAB — TSH: TSH: 1.2 u[IU]/mL (ref 0.450–4.500)

## 2024-01-06 DIAGNOSIS — R002 Palpitations: Secondary | ICD-10-CM

## 2024-01-06 DIAGNOSIS — R1013 Epigastric pain: Secondary | ICD-10-CM
